# Patient Record
Sex: Male | Born: 1970 | Hispanic: Yes | Marital: Married | Smoking: Never smoker
Health system: Southern US, Community
[De-identification: ages and names within clinical notes are randomized; demographics above are authoritative.]

## PROBLEM LIST (undated history)

## (undated) DIAGNOSIS — K7460 Unspecified cirrhosis of liver: Secondary | ICD-10-CM

## (undated) DIAGNOSIS — K8010 Calculus of gallbladder with chronic cholecystitis without obstruction: Secondary | ICD-10-CM

## (undated) DIAGNOSIS — E785 Hyperlipidemia, unspecified: Secondary | ICD-10-CM

## (undated) DIAGNOSIS — I10 Essential (primary) hypertension: Secondary | ICD-10-CM

## (undated) HISTORY — DX: Essential (primary) hypertension: I10

## (undated) HISTORY — DX: Hyperlipidemia, unspecified: E78.5

## (undated) HISTORY — PX: CHOLECYSTECTOMY: SHX55

## (undated) HISTORY — PX: HAND SURGERY: SHX662

---

## 2011-10-22 DIAGNOSIS — F32A Depression, unspecified: Secondary | ICD-10-CM

## 2011-10-22 DIAGNOSIS — E119 Type 2 diabetes mellitus without complications: Secondary | ICD-10-CM | POA: Insufficient documentation

## 2011-10-22 DIAGNOSIS — K7581 Nonalcoholic steatohepatitis (NASH): Secondary | ICD-10-CM

## 2011-10-22 DIAGNOSIS — E785 Hyperlipidemia, unspecified: Secondary | ICD-10-CM | POA: Insufficient documentation

## 2011-10-22 DIAGNOSIS — R809 Proteinuria, unspecified: Secondary | ICD-10-CM

## 2011-10-22 DIAGNOSIS — Z794 Long term (current) use of insulin: Secondary | ICD-10-CM

## 2011-10-22 DIAGNOSIS — G4733 Obstructive sleep apnea (adult) (pediatric): Secondary | ICD-10-CM | POA: Insufficient documentation

## 2011-10-22 DIAGNOSIS — Z8719 Personal history of other diseases of the digestive system: Secondary | ICD-10-CM | POA: Insufficient documentation

## 2011-10-22 HISTORY — DX: Type 2 diabetes mellitus without complications: E11.9

## 2011-10-22 HISTORY — DX: Type 2 diabetes mellitus without complications: Z79.4

## 2011-10-22 HISTORY — DX: Proteinuria, unspecified: R80.9

## 2011-10-22 HISTORY — DX: Nonalcoholic steatohepatitis (NASH): K75.81

## 2011-10-22 HISTORY — DX: Obstructive sleep apnea (adult) (pediatric): G47.33

## 2011-10-22 HISTORY — DX: Personal history of other diseases of the digestive system: Z87.19

## 2011-10-22 HISTORY — DX: Depression, unspecified: F32.A

## 2011-10-22 HISTORY — DX: Hemochromatosis, unspecified: E83.119

## 2015-05-27 DIAGNOSIS — L600 Ingrowing nail: Secondary | ICD-10-CM

## 2015-05-27 HISTORY — DX: Ingrowing nail: L60.0

## 2016-04-28 DIAGNOSIS — N5203 Combined arterial insufficiency and corporo-venous occlusive erectile dysfunction: Secondary | ICD-10-CM

## 2016-04-28 HISTORY — DX: Combined arterial insufficiency and corporo-venous occlusive erectile dysfunction: N52.03

## 2016-10-26 DIAGNOSIS — S86112A Strain of other muscle(s) and tendon(s) of posterior muscle group at lower leg level, left leg, initial encounter: Secondary | ICD-10-CM | POA: Insufficient documentation

## 2016-10-26 HISTORY — DX: Strain of other muscle(s) and tendon(s) of posterior muscle group at lower leg level, left leg, initial encounter: S86.112A

## 2017-12-02 DIAGNOSIS — M7989 Other specified soft tissue disorders: Secondary | ICD-10-CM

## 2017-12-02 HISTORY — DX: Other specified soft tissue disorders: M79.89

## 2021-08-07 DIAGNOSIS — E1165 Type 2 diabetes mellitus with hyperglycemia: Secondary | ICD-10-CM

## 2021-08-07 DIAGNOSIS — L83 Acanthosis nigricans: Secondary | ICD-10-CM | POA: Insufficient documentation

## 2021-08-07 DIAGNOSIS — I10 Essential (primary) hypertension: Secondary | ICD-10-CM | POA: Insufficient documentation

## 2021-08-07 HISTORY — DX: Essential (primary) hypertension: I10

## 2021-08-07 HISTORY — DX: Type 2 diabetes mellitus with hyperglycemia: E11.65

## 2021-10-28 ENCOUNTER — Telehealth: Payer: Self-pay | Admitting: Family

## 2021-10-28 NOTE — Telephone Encounter (Signed)
Patient called wanting a new patient appointment with Nathaniel Cox, He was advised that np appointments are not available until sometime in February. He stated that he knew Nathaniel Cox from a long time ago and now that he is in the area he would really like to see her sooner since he needs med refills and really doesn't want to go back to his current PCP. A new patient appointment was made for February and he was added to the wait list. He also wanted a note sent to see if Nathaniel Cox would be able to squeeze him in sooner. Please advice.

## 2021-11-03 NOTE — Telephone Encounter (Signed)
Spoke with patient; he is scheduled to be seen on 11/12/2021 for new patient appointment at 3 pm; he is aware that provider is in [facility] and is fine to drive from [place].

## 2021-11-12 ENCOUNTER — Encounter: Payer: Self-pay | Admitting: Family

## 2021-11-12 ENCOUNTER — Ambulatory Visit (INDEPENDENT_AMBULATORY_CARE_PROVIDER_SITE_OTHER): Payer: Commercial Managed Care - PPO | Admitting: Family

## 2021-11-12 VITALS — BP 122/80 | HR 83 | Temp 98.2°F | Resp 18 | Ht 70.0 in | Wt 219.6 lb

## 2021-11-12 DIAGNOSIS — R7989 Other specified abnormal findings of blood chemistry: Secondary | ICD-10-CM | POA: Diagnosis not present

## 2021-11-12 DIAGNOSIS — E119 Type 2 diabetes mellitus without complications: Secondary | ICD-10-CM

## 2021-11-12 DIAGNOSIS — Z794 Long term (current) use of insulin: Secondary | ICD-10-CM

## 2021-11-12 MED ORDER — LISINOPRIL 5 MG PO TABS: 5.0000 mg | ORAL_TABLET | Freq: Every day | ORAL | 3 refills | Status: DC

## 2021-11-12 MED ORDER — SIMVASTATIN 20 MG PO TABS: 20.0000 mg | ORAL_TABLET | Freq: Every day | ORAL | 3 refills | Status: DC

## 2021-11-12 MED ORDER — METFORMIN HCL 1000 MG PO TABS: 1000.0000 mg | ORAL_TABLET | Freq: Two times a day (BID) | ORAL | 3 refills | Status: DC

## 2021-11-12 MED ORDER — ESCITALOPRAM OXALATE 10 MG PO TABS: 10.0000 mg | ORAL_TABLET | Freq: Every day | ORAL | 3 refills | Status: DC

## 2021-11-12 NOTE — Progress Notes (Signed)
Nathaniel Cox is a 50 y.o. male with the following history as recorded in EpicCare:  There are no problems to display for this patient.   Current Outpatient Medications  Medication Sig Dispense Refill   OneTouch Delica Lancets 03T MISC USE AS INSTRUCTED TID.     sildenafil (REVATIO) 20 MG tablet 3-4 po qd prn     BD PEN NEEDLE NANO 2ND GEN 32G X 4 MM MISC USE THREE TIMES DAILY AS NEEDED     escitalopram (LEXAPRO) 10 MG tablet Take 1 tablet (10 mg total) by mouth daily. 90 tablet 3   ketoconazole (NIZORAL) 2 % cream Apply topically daily.     lisinopril (ZESTRIL) 5 MG tablet Take 1 tablet (5 mg total) by mouth daily. 90 tablet 3   metFORMIN (GLUCOPHAGE) 1000 MG tablet Take 1 tablet (1,000 mg total) by mouth 2 (two) times daily with a meal. 180 tablet 3   Multiple Vitamin (MULTI-VITAMIN) tablet Take 1 tablet by mouth daily.     SEMGLEE, YFGN, 100 UNIT/ML Pen Inject 30 Units into the skin at bedtime.     simvastatin (ZOCOR) 20 MG tablet Take 1 tablet (20 mg total) by mouth at bedtime. 90 tablet 3   No current facility-administered medications for this visit.    Allergies: Atorvastatin  Past Medical History:  Diagnosis Date   Hyperlipidemia    Hypertension     The histories are not reviewed yet. Please review them in the "History" navigator section and refresh this [place].  Family History  Problem Relation Age of Onset   Liver cancer Mother    Liver disease Mother    Lung cancer Mother    Hyperlipidemia Father    Hypertension Father    Coronary artery disease Father    Coronary artery disease Brother     Social History   Tobacco Use   Smoking status: Not on file   Smokeless tobacco: Not on file  Substance Use Topics   Alcohol use: Not on file    Subjective:  Presents today as a new patient;  History of Type 2 Diabetes- last Hgba1c at 9.3 in September 2022; currently on 30 units of insulin at night and Metformin; has not been on Metformin since October 2022;  Requesting  refills on medication today;   Complaining of chronic fatigue- wonders about testosterone; not responsive to Sildenafil either; worsening ED issues recently;    Objective:  Vitals:   11/12/21 1531  BP: 122/80  Pulse: 83  Resp: 18  Temp: 98.2 F (36.8 C)  TempSrc: Oral  SpO2: 96%  Weight: 219 lb 9.6 oz (99.6 kg)  Height: 5\' 10"  (1.778 m)    General: Well developed, well nourished, in no acute distress  Skin : Warm and dry.  Head: Normocephalic and atraumatic  Lungs: Respirations unlabored; clear to auscultation bilaterally without wheeze, rales, rhonchi  CVS exam: normal rate and regular rhythm.  Neurologic: Alert and oriented; speech intact; face symmetrical; moves all extremities well; CNII-XII intact without focal deficit   Assessment:  1. Type 2 diabetes mellitus without complication, with long-term current use of insulin ([facility])   2. Low testosterone     Plan:  Will need to update labs today to check Hgba1c; concern for control; will most likely need to add GLP-1; follow up to be determined; Update testosterone level today; follow up to be determined;  Information given regarding Cologuard- he will call insurance to discuss coverage and let us know he wants to proceed.  This visit occurred during the SARS-CoV-2 public health emergency.  Safety protocols were in place, including screening questions prior to the visit, additional usage of staff PPE, and extensive cleaning of exam room while observing appropriate contact time as indicated for disinfecting solutions.    No follow-ups on file.  Orders Placed This Encounter  Procedures   Testosterone Total,Free,Bio, Males-([facility])    Requested Prescriptions   Signed Prescriptions Disp Refills   metFORMIN (GLUCOPHAGE) 1000 MG tablet 180 tablet 3    Sig: Take 1 tablet (1,000 mg total) by mouth 2 (two) times daily with a meal.   simvastatin (ZOCOR) 20 MG tablet 90 tablet 3    Sig: Take 1 tablet (20 mg total) by mouth at  bedtime.   lisinopril (ZESTRIL) 5 MG tablet 90 tablet 3    Sig: Take 1 tablet (5 mg total) by mouth daily.   escitalopram (LEXAPRO) 10 MG tablet 90 tablet 3    Sig: Take 1 tablet (10 mg total) by mouth daily.

## 2021-11-13 LAB — TESTOSTERONE TOTAL,FREE,BIO, MALES
Albumin: 4.2 g/dL (ref 3.6–5.1)
Sex Hormone Binding: 30 nmol/L (ref 10–50)
Testosterone, Bioavailable: 69.5 ng/dL — ABNORMAL LOW (ref 110.0–575.0)
Testosterone, Free: 36.1 pg/mL — ABNORMAL LOW (ref 46.0–224.0)
Testosterone: 262 ng/dL (ref 250–827)

## 2021-11-17 ENCOUNTER — Other Ambulatory Visit: Payer: Self-pay | Admitting: Family

## 2021-11-17 DIAGNOSIS — N529 Male erectile dysfunction, unspecified: Secondary | ICD-10-CM

## 2021-11-17 DIAGNOSIS — R7989 Other specified abnormal findings of blood chemistry: Secondary | ICD-10-CM

## 2021-11-17 MED ORDER — TIRZEPATIDE 2.5 MG/0.5ML ~~LOC~~ SOAJ: 2.5000 mg | SUBCUTANEOUS | 1 refills | Status: DC

## 2021-11-19 DIAGNOSIS — E291 Testicular hypofunction: Secondary | ICD-10-CM

## 2021-11-19 HISTORY — DX: Testicular hypofunction: E29.1

## 2021-11-19 NOTE — Progress Notes (Signed)
11/23/21 2:20 PM   Nathaniel Cox 06/05/1971 [id number]  Referring provider:  Marrian Salvage, [address] [place],  [place] [address] Chief Complaint  Patient presents with   New Patient (Initial Visit)   Erectile Dysfunction     HPI: Nathaniel Cox is a 50 y.o.male who presents today for further evaluation of erectile dysfunction and testosterone.   He has previously tried sildenafil but was not responsive to it. He has been having trouble maintaining and achieving erections for quite some time.  He saw his PCP, Jodi Mourning, FNP, on 11/12/2021 complaining of chronic fatigue. His testosterone was tested. Total testosterone 262.  Free testosterone was 36.1 and testosterone bioavailable was 69.5. He was further referred to urology.   Shim and Adam scores below.  He does think he is symptomatic from low testosterone.  His primary complaints are issues with erections along with low libido, overall lack of energy, mood changes, decreased sports agility amongst others.  He overall just does not feel as robust as before.  Recent PSA 0.52 on 08/2020.   Androgen Deficiency in the Aging Male     [place] Name 11/20/21 1500         Androgen Deficiency in the Aging Male   Do you have a decrease in libido (sex drive) Yes     Do you have lack of energy Yes     Do you have a decrease in strength and/or endurance No     Have you lost height No     Have you noticed a decreased enjoyment of life No     Are you sad and/or grumpy Yes     Are your erections less strong Yes     Have you noticed a recent deterioration in your ability to play sports Yes     Are you falling asleep after dinner No     Has there been a recent deterioration in your work performance No              Androgen Deficiency in the Aging Male     [place] Name 11/20/21 1500         Androgen Deficiency in the Aging Male   Do you have a decrease in libido (sex drive) Yes     Do you have lack  of energy Yes     Do you have a decrease in strength and/or endurance No     Have you lost height No     Have you noticed a decreased enjoyment of life No     Are you sad and/or grumpy Yes     Are your erections less strong Yes     Have you noticed a recent deterioration in your ability to play sports Yes     Are you falling asleep after dinner No     Has there been a recent deterioration in your work performance No              SHIM     [place] Name 11/20/21 1513         SHIM: Over the last 6 months:   How do you rate your confidence that you could get and keep an erection? Low     When you had erections with sexual stimulation, how often were your erections hard enough for penetration (entering your partner)? A Few Times (much less than half the time)     During sexual intercourse, how often were you able  to maintain your erection after you had penetrated (entered) your partner? A Few Times (much less than half the time)     During sexual intercourse, how difficult was it to maintain your erection to completion of intercourse? Difficult     When you attempted sexual intercourse, how often was it satisfactory for you? Sometimes (about half the time)       SHIM Total Score   SHIM 12                PMH: Past Medical History:  Diagnosis Date   Combined arterial insufficiency and corporo-venous occlusive erectile dysfunction 04/28/2016   Depression 10/22/2011   Essential hypertension 08/07/2021   Gastrocnemius muscle strain, left, initial encounter 10/26/2016   Hemochromatosis 10/22/2011   Formatting of this note might be different from the original. Managed by [facility]; diagnosis has been questioned as LFTs have normalized without need for continued blood draws   History of constipation 10/22/2011   Hyperlipidemia    Hypertension    Hypogonadism in male 11/19/2021   Microalbuminuria 10/22/2011   NASH (nonalcoholic steatohepatitis) 10/22/2011   Formatting of this note  might be different from the original. Followed by [facility]   Onychocryptosis 05/27/2015   OSA (obstructive sleep apnea) 10/22/2011   Formatting of this note might be different from the original. Apr 25, 2013:  [facility]omn[facility] study at [facility] revealed mild obstructive sleep apnea with an overall index of 14 events per our.  Oxygen saturation minimum 77%.  Epworth sleepiness Scale score:  22/24.  BMI:  34. Neck size:  16.5 in.   Swelling of finger of right hand 12/02/2017   Type 2 diabetes mellitus without complication, with long-term current use of insulin ([place]) 10/22/2011   Uncontrolled type 2 diabetes mellitus with hyperglycemia ([place]) 08/07/2021    Surgical History: Past Surgical History:  Procedure Laterality Date   HAND SURGERY Right     Home Medications:  Allergies as of 11/20/2021       Reactions   Atorvastatin Other (See Comments)   Other Reaction: muscle aches        Medication List        Accurate as of November 20, 2021 11:59 PM. If you have any questions, ask your nurse or doctor.          BD Pen Needle Nano 2nd Gen 32G X 4 MM Misc Generic drug: Insulin Pen Needle USE THREE TIMES DAILY AS NEEDED   escitalopram 10 MG tablet Commonly known as: LEXAPRO Take 1 tablet (10 mg total) by mouth daily.   ketoconazole 2 % cream Commonly known as: NIZORAL Apply topically daily.   lisinopril 5 MG tablet Commonly known as: ZESTRIL Take 1 tablet (5 mg total) by mouth daily.   metFORMIN 1000 MG tablet Commonly known as: GLUCOPHAGE Take 1 tablet (1,000 mg total) by mouth 2 (two) times daily with a meal.   Multi-Vitamin tablet Take 1 tablet by mouth daily.   OneTouch Delica Lancets 33A Misc USE AS INSTRUCTED TID.   Semglee (yfgn) 100 UNIT/ML Pen Generic drug: insulin glargine-yfgn Inject 30 Units into the skin at bedtime.   sildenafil 20 MG tablet Commonly known as: REVATIO 3-4 po qd prn   simvastatin 20 MG tablet Commonly known as: ZOCOR Take  1 tablet (20 mg total) by mouth at bedtime.   tadalafil 20 MG tablet Commonly known as: CIALIS Take 1 tablet (20 mg total) by mouth daily as needed for erectile dysfunction. Started by: Caryl Pina  Erlene Quan, MD   tirzepatide 2.5 MG/0.5ML Pen Commonly known as: MOUNJARO Inject 2.5 mg into the skin once a week.        Allergies:  Allergies  Allergen Reactions   Atorvastatin Other (See Comments)    Other Reaction: muscle aches    Family History: Family History  Problem Relation Age of Onset   Liver cancer Mother    Liver disease Mother    Lung cancer Mother    Hyperlipidemia Father    Hypertension Father    Coronary artery disease Father    Coronary artery disease Brother     Social History:  reports that he has never smoked. He has never used smokeless tobacco. He reports that he does not drink alcohol and does not use drugs.   Physical Exam: BP 128/77    Pulse 68    Ht 5\' 10"  (1.778 m)    Wt 220 lb (99.8 kg)    BMI 31.57 kg/m   Constitutional:  Alert and oriented, No acute distress. HEENT: [place] AT, moist mucus membranes.  Trachea midline, no masses. Cardiovascular: No clubbing, cyanosis, or edema. Respiratory: Normal respiratory effort, no increased work of breathing. Rectal: Normal sphincter tone.  Small prostate, nontender, no nodules. Skin: No rashes, bruises or suspicious lesions. Neurologic: Grossly intact, no focal deficits, moving all 4 extremities. Psychiatric: Normal mood and affect.  Laboratory Data: Lab Results  Component Value Date   TESTOSTERONE 262 11/12/2021    Assessment & Plan:    1.  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 Discussed possible treatment options including PDE 5 inhibitors, vacuum erectile device, intracavernosal injection, MUSE, and placement of the inflatable or malleable penile prosthesis for refractory cases.  Given that he is  failed to respond to sildenafil, will try him on generic Cialis.  We discussed take this medication at least 2 hours prior to intercourse to be used up to every 72 hours as needed.  In the interim, work on #2 to see if this helps his symptoms.  We also discussed that at his relatively young age, it is important to consider underlying cardiovascular disease.  He has been evaluated for this given his family history and has no issues.  2. Hypogonadism in male We discussed the pathophysiology of low testosterone.  His testosterone is low normal which may be contributing to #1  Discussed the risk and benefits of testosterone repletion including need for screening labs including H&H/PSA, ruling out other underlying conditions, possible side effects of the medication including cessation of bone endogenous testosterone production as well as how it may and impact spermatogenesis and fertility.  He is not worried about this last issue.  We will plan to get some preoperative labs and if these are unremarkable with persistent low testosterone, he is most interested in testosterone cypionate.  We discussed the alternative including Clomid.  If he does elect to pursue this, we will order the medication and have him follow-up with [facility] for injection teaching.  He is agreeable this plan.  He understands our office protocol.   Follow-up labs, then to injection teaching [facility] [address] [place], [place] [address] [phone]

## 2021-11-20 ENCOUNTER — Ambulatory Visit: Payer: Commercial Managed Care - PPO | Admitting: Urology

## 2021-11-20 ENCOUNTER — Encounter: Payer: Self-pay | Admitting: Urology

## 2021-11-20 ENCOUNTER — Other Ambulatory Visit: Payer: Self-pay

## 2021-11-20 VITALS — BP 128/77 | HR 68 | Ht 70.0 in | Wt 220.0 lb

## 2021-11-20 DIAGNOSIS — N528 Other male erectile dysfunction: Secondary | ICD-10-CM

## 2021-11-20 DIAGNOSIS — L83 Acanthosis nigricans: Secondary | ICD-10-CM

## 2021-11-20 DIAGNOSIS — E291 Testicular hypofunction: Secondary | ICD-10-CM | POA: Diagnosis not present

## 2021-11-20 MED ORDER — TADALAFIL 20 MG PO TABS: 20.0000 mg | ORAL_TABLET | Freq: Every day | ORAL | 11 refills | Status: DC | PRN

## 2021-11-23 ENCOUNTER — Encounter: Payer: Self-pay | Admitting: Urology

## 2021-11-23 LAB — HM DIABETES EYE EXAM

## 2021-12-03 ENCOUNTER — Other Ambulatory Visit: Payer: Commercial Managed Care - PPO

## 2021-12-03 ENCOUNTER — Other Ambulatory Visit: Payer: Self-pay

## 2021-12-03 DIAGNOSIS — N528 Other male erectile dysfunction: Secondary | ICD-10-CM

## 2021-12-03 DIAGNOSIS — E291 Testicular hypofunction: Secondary | ICD-10-CM

## 2021-12-03 DIAGNOSIS — L83 Acanthosis nigricans: Secondary | ICD-10-CM

## 2021-12-04 LAB — HEMOGLOBIN AND HEMATOCRIT, BLOOD
Hematocrit: 43.2 % (ref 37.5–51.0)
Hemoglobin: 15.2 g/dL (ref 13.0–17.7)

## 2021-12-04 LAB — FSH/LH
FSH: 4.2 m[IU]/mL (ref 1.5–12.4)
LH: 6.1 m[IU]/mL (ref 1.7–8.6)

## 2021-12-04 LAB — PROLACTIN: Prolactin: 6.1 ng/mL (ref 4.0–15.2)

## 2021-12-04 LAB — PSA: Prostate Specific Ag, Serum: 0.6 ng/mL (ref 0.0–4.0)

## 2021-12-04 LAB — TESTOSTERONE: Testosterone: 298 ng/dL (ref 264–916)

## 2021-12-14 ENCOUNTER — Ambulatory Visit: Payer: Commercial Managed Care - PPO | Admitting: Urology

## 2021-12-14 ENCOUNTER — Telehealth: Payer: Self-pay | Admitting: *Deleted

## 2021-12-14 ENCOUNTER — Other Ambulatory Visit: Payer: Self-pay | Admitting: Urology

## 2021-12-14 DIAGNOSIS — E291 Testicular hypofunction: Secondary | ICD-10-CM

## 2021-12-14 MED ORDER — TESTOSTERONE CYPIONATE 200 MG/ML IM SOLN
200.0000 mg | INTRAMUSCULAR | 0 refills | Status: DC
Start: 2021-12-14 — End: 2022-01-12

## 2021-12-14 NOTE — Telephone Encounter (Signed)
It looks like [facility] sent this prescription in earlier today.  Please confirm that that is the case.  Hollice Espy, MD

## 2021-12-14 NOTE — Progress Notes (Signed)
Patient did not have his testosterone medication with him, so appointment was rescheduled.

## 2021-12-14 NOTE — Telephone Encounter (Signed)
Patient has an appointment with Zara Council, PA tomorrow for testosterone teaching. Requests testosterone sent to [facility] [address]

## 2021-12-15 ENCOUNTER — Other Ambulatory Visit: Payer: Self-pay

## 2021-12-15 ENCOUNTER — Ambulatory Visit (INDEPENDENT_AMBULATORY_CARE_PROVIDER_SITE_OTHER): Payer: Commercial Managed Care - PPO | Admitting: Urology

## 2021-12-15 ENCOUNTER — Encounter: Payer: Self-pay | Admitting: Urology

## 2021-12-15 VITALS — BP 108/70 | HR 65 | Ht 70.0 in | Wt 212.0 lb

## 2021-12-15 DIAGNOSIS — E349 Endocrine disorder, unspecified: Secondary | ICD-10-CM

## 2021-12-21 ENCOUNTER — Telehealth: Payer: Commercial Managed Care - PPO

## 2021-12-21 NOTE — Telephone Encounter (Signed)
PA initiated via [email or web address], EOC ID: [id number]. Awaiting determination.

## 2021-12-23 NOTE — Telephone Encounter (Signed)
PA approved.  Drug/Service Name: MOUNJARO 2.5 MG/0.5 ML PEN Physician/Nurse: Jodi Mourning EOC ID: [id number] Status: Approved Date Requested: 12/21/2021 09:04:37 Date Closed: 12/22/2021 19:24:52 Dispensing Location: Retail Pharm[facility]   Est.Time of Completion: N/A

## 2021-12-24 ENCOUNTER — Encounter: Payer: Self-pay | Admitting: Family

## 2021-12-24 NOTE — Progress Notes (Signed)
No DM Retinopathy

## 2021-12-25 ENCOUNTER — Ambulatory Visit: Payer: Commercial Managed Care - PPO | Admitting: Urology

## 2022-01-01 ENCOUNTER — Ambulatory Visit: Payer: Commercial Managed Care - PPO | Admitting: Family

## 2022-01-07 ENCOUNTER — Ambulatory Visit: Payer: Commercial Managed Care - PPO | Admitting: Family

## 2022-01-12 ENCOUNTER — Other Ambulatory Visit: Payer: Self-pay | Admitting: Family

## 2022-01-12 ENCOUNTER — Ambulatory Visit: Payer: Commercial Managed Care - PPO | Admitting: Family

## 2022-01-12 VITALS — BP 120/80 | HR 56 | Temp 98.0°F | Resp 18 | Ht 70.0 in | Wt 220.6 lb

## 2022-01-12 DIAGNOSIS — E785 Hyperlipidemia, unspecified: Secondary | ICD-10-CM | POA: Diagnosis not present

## 2022-01-12 DIAGNOSIS — Z794 Long term (current) use of insulin: Secondary | ICD-10-CM

## 2022-01-12 DIAGNOSIS — E119 Type 2 diabetes mellitus without complications: Secondary | ICD-10-CM

## 2022-01-12 LAB — CBC WITH DIFFERENTIAL/PLATELET
Basophils Absolute: 0 10*3/uL (ref 0.0–0.1)
Basophils Relative: 0.6 % (ref 0.0–3.0)
Eosinophils Absolute: 0.6 10*3/uL (ref 0.0–0.7)
Eosinophils Relative: 9.6 % — ABNORMAL HIGH (ref 0.0–5.0)
HCT: 45.3 % (ref 39.0–52.0)
Hemoglobin: 15.2 g/dL (ref 13.0–17.0)
Lymphocytes Relative: 33.7 % (ref 12.0–46.0)
Lymphs Abs: 2.1 10*3/uL (ref 0.7–4.0)
MCHC: 33.6 g/dL (ref 30.0–36.0)
MCV: 91.5 fl (ref 78.0–100.0)
Monocytes Absolute: 0.5 10*3/uL (ref 0.1–1.0)
Monocytes Relative: 8.5 % (ref 3.0–12.0)
Neutro Abs: 3 10*3/uL (ref 1.4–7.7)
Neutrophils Relative %: 47.6 % (ref 43.0–77.0)
Platelets: 244 10*3/uL (ref 150.0–400.0)
RBC: 4.94 Mil/uL (ref 4.22–5.81)
RDW: 12.7 % (ref 11.5–15.5)
WBC: 6.4 10*3/uL (ref 4.0–10.5)

## 2022-01-12 LAB — COMPREHENSIVE METABOLIC PANEL
ALT: 193 U/L — ABNORMAL HIGH (ref 0–53)
AST: 107 U/L — ABNORMAL HIGH (ref 0–37)
Albumin: 4.4 g/dL (ref 3.5–5.2)
Alkaline Phosphatase: 79 U/L (ref 39–117)
BUN: 10 mg/dL (ref 6–23)
CO2: 28 mEq/L (ref 19–32)
Calcium: 10 mg/dL (ref 8.4–10.5)
Chloride: 103 mEq/L (ref 96–112)
Creatinine, Ser: 0.86 mg/dL (ref 0.40–1.50)
GFR: 99.94 mL/min (ref >60.00)
Glucose, Bld: 144 mg/dL — ABNORMAL HIGH (ref 70–99)
Potassium: 4.5 mEq/L (ref 3.5–5.1)
Sodium: 137 mEq/L (ref 135–145)
Total Bilirubin: 0.7 mg/dL (ref 0.2–1.2)
Total Protein: 8 g/dL (ref 6.0–8.3)

## 2022-01-12 LAB — HEMOGLOBIN A1C: Hgb A1c MFr Bld: 9.2 % — ABNORMAL HIGH (ref 4.6–6.5)

## 2022-01-12 MED ORDER — GLUCOSE BLOOD VI STRP: ORAL_STRIP | 12 refills | Status: AC

## 2022-01-12 MED ORDER — TIRZEPATIDE 5 MG/0.5ML ~~LOC~~ SOAJ: 5.0000 mg | SUBCUTANEOUS | 0 refills | Status: DC

## 2022-01-12 MED ORDER — METFORMIN HCL 1000 MG PO TABS: 1000.0000 mg | ORAL_TABLET | Freq: Two times a day (BID) | ORAL | 3 refills | Status: DC

## 2022-01-12 MED ORDER — SIMVASTATIN 20 MG PO TABS: 20.0000 mg | ORAL_TABLET | Freq: Every day | ORAL | 3 refills | Status: DC

## 2022-01-12 NOTE — Progress Notes (Signed)
Nathaniel Cox is a 51 y.o. male with the following history as recorded in EpicCare:  Patient Active Problem List   Diagnosis Date Noted   Hypogonadism in male 11/19/2021   Acanthosis nigricans 08/07/2021   Essential hypertension 08/07/2021   Uncontrolled type 2 diabetes mellitus with hyperglycemia ([place]) 08/07/2021   Swelling of finger of right hand 12/02/2017   Gastrocnemius muscle strain, left, initial encounter 10/26/2016   Combined arterial insufficiency and corporo-venous occlusive erectile dysfunction 04/28/2016   Onychocryptosis 05/27/2015   Depression 10/22/2011   Hemochromatosis 10/22/2011   History of constipation 10/22/2011   Hyperlipidemia 10/22/2011   Microalbuminuria 10/22/2011   NASH (nonalcoholic steatohepatitis) 10/22/2011   OSA (obstructive sleep apnea) 10/22/2011   Type 2 diabetes mellitus without complication, with long-term current use of insulin ([place]) 10/22/2011    Current Outpatient Medications  Medication Sig Dispense Refill   BD PEN NEEDLE NANO 2ND GEN 32G X 4 MM MISC USE THREE TIMES DAILY AS NEEDED     escitalopram (LEXAPRO) 10 MG tablet Take 1 tablet (10 mg total) by mouth daily. 90 tablet 3   glucose blood test strip Use as instructed to check blood sugar 100 each 12   ketoconazole (NIZORAL) 2 % cream Apply topically daily.     lisinopril (ZESTRIL) 5 MG tablet Take 1 tablet (5 mg total) by mouth daily. 90 tablet 3   Multiple Vitamin (MULTI-VITAMIN) tablet Take 1 tablet by mouth daily.     sildenafil (REVATIO) 20 MG tablet 3-4 po qd prn     tadalafil (CIALIS) 20 MG tablet Take 1 tablet (20 mg total) by mouth daily as needed for erectile dysfunction. 6 tablet 11   tirzepatide (MOUNJARO) 5 MG/0.5ML Pen Inject 5 mg into the skin once a week. 2 mL 0   metFORMIN (GLUCOPHAGE) 1000 MG tablet Take 1 tablet (1,000 mg total) by mouth 2 (two) times daily with a meal. (Patient not taking: Reported on 01/12/2022) 180 tablet 3   SEMGLEE, YFGN, 100 UNIT/ML Pen Inject 30  Units into the skin at bedtime. (Patient not taking: Reported on 01/12/2022)     simvastatin (ZOCOR) 20 MG tablet Take 1 tablet (20 mg total) by mouth at bedtime. 90 tablet 3   No current facility-administered medications for this visit.    Allergies: Atorvastatin  Past Medical History:  Diagnosis Date   Combined arterial insufficiency and corporo-venous occlusive erectile dysfunction 04/28/2016   Depression 10/22/2011   Essential hypertension 08/07/2021   Gastrocnemius muscle strain, left, initial encounter 10/26/2016   Hemochromatosis 10/22/2011   Formatting of this note might be different from the original. Managed by [facility]; diagnosis has been questioned as LFTs have normalized without need for continued blood draws   History of constipation 10/22/2011   Hyperlipidemia    Hypertension    Hypogonadism in male 11/19/2021   Microalbuminuria 10/22/2011   NASH (nonalcoholic steatohepatitis) 10/22/2011   Formatting of this note might be different from the original. Followed by [facility]   Onychocryptosis 05/27/2015   OSA (obstructive sleep apnea) 10/22/2011   Formatting of this note might be different from the original. Apr 25, 2013:  Polysomnography study at [facility] revealed mild obstructive sleep apnea with an overall index of 14 events per our.  Oxygen saturation minimum 77%.  Epworth sleepiness Scale score:  22/24.  BMI:  34. Neck size:  16.5 in.   Swelling of finger of right hand 12/02/2017   Type 2 diabetes mellitus without complication, with long-term current use of insulin (  [place]) 10/22/2011   Uncontrolled type 2 diabetes mellitus with hyperglycemia ([place]) 08/07/2021    Past Surgical History:  Procedure Laterality Date   HAND SURGERY Right     Family History  Problem Relation Age of Onset   Liver cancer Mother    Liver disease Mother    Lung cancer Mother    Hyperlipidemia Father    Hypertension Father    Coronary artery disease Father    Coronary artery disease  Brother     Social History   Tobacco Use   Smoking status: Never   Smokeless tobacco: Never  Substance Use Topics   Alcohol use: Never    Subjective:  Presents for follow up on Type 2 Diabetes; per patient, has only been on Mounjaro 2.5 mg  x 7 weeks and "feel great." Feeling much better off the Metformin and insulin; fasting sugars around 150; able to exercise daily; sleeping well;  Also needs refills on test strips and Simvastatin;  Opted to stop follow up with urology- not covered by insurance;       Objective:  Vitals:   01/12/22 0816  BP: 120/80  Pulse: (!) 56  Resp: 18  Temp: 98 F (36.7 C)  TempSrc: Oral  SpO2: 96%  Weight: 220 lb 9.6 oz (100.1 kg)  Height: $Remove'5\' 10"'[id number]$  (1.778 m)    General: Well developed, well nourished, in no acute distress  Skin : Warm and dry.  Head: Normocephalic and atraumatic  Eyes: Sclera and conjunctiva clear; pupils round and reactive to light; extraocular movements intact  Ears: External normal; canals clear; tympanic membranes normal  Oropharynx: Pink, supple. No suspicious lesions  Neck: Supple without thyromegaly, adenopathy  Lungs: Respirations unlabored; clear to auscultation bilaterally without wheeze, rales, rhonchi  CVS exam: normal rate and regular rhythm.  Neurologic: Alert and oriented; speech intact; face symmetrical; moves all extremities well; CNII-XII intact without focal deficit   Assessment:  1. Type 2 diabetes mellitus without complication, with long-term current use of insulin ([place])   2. Hyperlipidemia, unspecified hyperlipidemia type   3. Hemochromatosis, unspecified hemochromatosis type     Plan:  Check hgba1c; increase Mounjaro to 5 mg weekly; follow up to be determined;  Refill updated; Check CMP today; has not required repeated blood draws for management;   This visit occurred during the SARS-CoV-2 public health emergency.  Safety protocols were in place, including screening questions prior to the visit,  additional usage of staff PPE, and extensive cleaning of exam room while observing appropriate contact time as indicated for disinfecting solutions.    No follow-ups on file.  Orders Placed This Encounter  Procedures   CBC with Differential/Platelet   Comp Met (CMET)   Hemoglobin A1c    Requested Prescriptions   Signed Prescriptions Disp Refills   tirzepatide (MOUNJARO) 5 MG/0.5ML Pen 2 mL 0    Sig: Inject 5 mg into the skin once a week.   glucose blood test strip 100 each 12    Sig: Use as instructed to check blood sugar   simvastatin (ZOCOR) 20 MG tablet 90 tablet 3    Sig: Take 1 tablet (20 mg total) by mouth at bedtime.

## 2022-01-14 ENCOUNTER — Other Ambulatory Visit: Payer: Commercial Managed Care - PPO

## 2022-01-14 LAB — IRON,TIBC AND FERRITIN PANEL
%SAT: 52 % (calc) — ABNORMAL HIGH (ref 20–48)
Ferritin: 253 ng/mL (ref 38–380)
Iron: 186 ug/dL — ABNORMAL HIGH (ref 50–180)
TIBC: 355 mcg/dL (calc) (ref 250–425)

## 2022-01-15 ENCOUNTER — Other Ambulatory Visit: Payer: Self-pay | Admitting: Family

## 2022-01-15 DIAGNOSIS — R7989 Other specified abnormal findings of blood chemistry: Secondary | ICD-10-CM

## 2022-01-22 ENCOUNTER — Other Ambulatory Visit: Payer: Self-pay | Admitting: Family

## 2022-01-26 ENCOUNTER — Ambulatory Visit: Payer: Self-pay | Admitting: Family

## 2022-01-27 ENCOUNTER — Ambulatory Visit
Admission: RE | Admit: 2022-01-27 | Discharge: 2022-01-27 | Disposition: A | Discharge status: Home / Self Care | Payer: Commercial Managed Care - PPO | Source: Ambulatory Visit | Attending: Family | Admitting: Family

## 2022-01-27 DIAGNOSIS — R7989 Other specified abnormal findings of blood chemistry: Secondary | ICD-10-CM

## 2022-02-09 ENCOUNTER — Other Ambulatory Visit: Payer: Self-pay | Admitting: Family

## 2022-02-19 ENCOUNTER — Encounter: Payer: Self-pay | Admitting: Family

## 2022-02-23 ENCOUNTER — Other Ambulatory Visit: Payer: Self-pay | Admitting: Family

## 2022-02-23 MED ORDER — DAPAGLIFLOZIN PROPANEDIOL 10 MG PO TABS: 10.0000 mg | ORAL_TABLET | Freq: Every day | ORAL | 2 refills | Status: DC

## 2022-03-08 ENCOUNTER — Other Ambulatory Visit: Payer: Self-pay | Admitting: Family

## 2022-03-10 ENCOUNTER — Encounter: Payer: Self-pay | Admitting: Family

## 2022-04-08 ENCOUNTER — Encounter: Payer: Self-pay | Admitting: Family

## 2022-04-29 ENCOUNTER — Telehealth: Payer: Self-pay | Admitting: Family

## 2022-04-29 NOTE — Telephone Encounter (Signed)
Patient states off monjuaro about one month Havent been on anything since, other than metformin  Mickel Baas was going to put another medication in place but nothing has been said/done yet   Please advise

## 2022-04-30 ENCOUNTER — Encounter: Payer: Self-pay | Admitting: Family

## 2022-04-30 NOTE — Telephone Encounter (Signed)
Called Pt and stated he will call to check on Rx

## 2022-05-05 ENCOUNTER — Other Ambulatory Visit: Payer: Self-pay | Admitting: Family

## 2022-05-05 DIAGNOSIS — K7581 Nonalcoholic steatohepatitis (NASH): Secondary | ICD-10-CM

## 2022-05-05 MED ORDER — EMPAGLIFLOZIN 10 MG PO TABS: 10.0000 mg | ORAL_TABLET | Freq: Every day | ORAL | 1 refills | Status: DC

## 2022-05-06 ENCOUNTER — Other Ambulatory Visit: Payer: Self-pay | Admitting: Family

## 2022-05-06 ENCOUNTER — Telehealth: Payer: Self-pay

## 2022-05-06 DIAGNOSIS — K7581 Nonalcoholic steatohepatitis (NASH): Secondary | ICD-10-CM

## 2022-05-06 NOTE — Telephone Encounter (Signed)
After speaking with Nathaniel Cox she let me know that this particular Doctor will not accept any referrals that does not have all the requirements: office notes, labs, etc. Meredith Mody says she did not see any of that information.

## 2022-05-07 NOTE — Telephone Encounter (Signed)
Called Pt was Advised

## 2022-05-10 ENCOUNTER — Other Ambulatory Visit: Payer: Self-pay | Admitting: Family

## 2022-05-10 MED ORDER — BD PEN NEEDLE NANO 2ND GEN 32G X 4 MM MISC: 0 refills | Status: DC

## 2022-05-10 MED ORDER — INSULIN GLARGINE-YFGN 100 UNIT/ML ~~LOC~~ SOPN
PEN_INJECTOR | SUBCUTANEOUS | 0 refills | Status: DC
Start: 2022-05-10 — End: 2022-05-12

## 2022-05-11 ENCOUNTER — Other Ambulatory Visit: Payer: Self-pay

## 2022-05-11 NOTE — Telephone Encounter (Signed)
Pts insurance faxed over a form stating that the Glarg-yfgn insulin is not covered/ preferred by insurance.

## 2022-05-12 ENCOUNTER — Encounter: Payer: Self-pay | Admitting: *Deleted

## 2022-05-12 ENCOUNTER — Other Ambulatory Visit: Payer: Self-pay | Admitting: Family

## 2022-05-12 MED ORDER — LANTUS SOLOSTAR 100 UNIT/ML ~~LOC~~ SOPN: PEN_INJECTOR | SUBCUTANEOUS | 1 refills | Status: DC

## 2022-06-08 ENCOUNTER — Encounter: Payer: Self-pay | Admitting: Family

## 2022-06-09 ENCOUNTER — Other Ambulatory Visit: Payer: Self-pay

## 2022-06-09 MED ORDER — TADALAFIL 20 MG PO TABS: 20.0000 mg | ORAL_TABLET | Freq: Every day | ORAL | 11 refills | Status: DC | PRN

## 2022-06-09 NOTE — Telephone Encounter (Signed)
Rx sent in

## 2022-06-25 ENCOUNTER — Other Ambulatory Visit: Payer: Self-pay | Admitting: Nurse Practitioner

## 2022-06-25 DIAGNOSIS — K7469 Other cirrhosis of liver: Secondary | ICD-10-CM | POA: Insufficient documentation

## 2022-06-25 DIAGNOSIS — K7581 Nonalcoholic steatohepatitis (NASH): Secondary | ICD-10-CM

## 2022-06-30 ENCOUNTER — Other Ambulatory Visit: Payer: Self-pay | Admitting: Nurse Practitioner

## 2022-06-30 ENCOUNTER — Other Ambulatory Visit (HOSPITAL_COMMUNITY): Payer: Self-pay | Admitting: Nurse Practitioner

## 2022-06-30 DIAGNOSIS — R7401 Elevation of levels of liver transaminase levels: Secondary | ICD-10-CM

## 2022-06-30 DIAGNOSIS — R768 Other specified abnormal immunological findings in serum: Secondary | ICD-10-CM

## 2022-07-09 ENCOUNTER — Other Ambulatory Visit: Payer: Self-pay | Admitting: Family

## 2022-07-09 ENCOUNTER — Other Ambulatory Visit (HOSPITAL_COMMUNITY): Payer: Self-pay

## 2022-07-09 ENCOUNTER — Encounter: Payer: Self-pay | Admitting: Family

## 2022-07-09 MED ORDER — ESCITALOPRAM OXALATE 20 MG PO TABS: 20.0000 mg | ORAL_TABLET | Freq: Every day | ORAL | 1 refills | Status: DC

## 2022-07-09 MED ORDER — OZEMPIC (0.25 OR 0.5 MG/DOSE) 2 MG/3ML ~~LOC~~ SOPN: 0.2500 mg | PEN_INJECTOR | SUBCUTANEOUS | 1 refills | Status: DC

## 2022-07-09 MED ORDER — SILDENAFIL CITRATE 20 MG PO TABS
ORAL_TABLET | ORAL | 2 refills | Status: DC
  Filled 2022-07-09: qty 10, 2d supply, fill #0

## 2022-07-15 ENCOUNTER — Other Ambulatory Visit (HOSPITAL_COMMUNITY): Payer: Self-pay

## 2022-07-19 ENCOUNTER — Other Ambulatory Visit (HOSPITAL_COMMUNITY): Payer: Self-pay | Admitting: Physician Assistant

## 2022-07-19 DIAGNOSIS — Z01818 Encounter for other preprocedural examination: Secondary | ICD-10-CM

## 2022-07-20 ENCOUNTER — Encounter (HOSPITAL_COMMUNITY): Payer: Self-pay

## 2022-07-20 ENCOUNTER — Ambulatory Visit (HOSPITAL_COMMUNITY)
Admission: RE | Admit: 2022-07-20 | Discharge: 2022-07-20 | Disposition: A | Discharge status: Home / Self Care | Payer: Commercial Managed Care - PPO | Source: Ambulatory Visit | Attending: Nurse Practitioner | Admitting: Nurse Practitioner

## 2022-07-20 DIAGNOSIS — R7401 Elevation of levels of liver transaminase levels: Secondary | ICD-10-CM | POA: Diagnosis not present

## 2022-07-20 DIAGNOSIS — R768 Other specified abnormal immunological findings in serum: Secondary | ICD-10-CM | POA: Insufficient documentation

## 2022-07-20 DIAGNOSIS — K7581 Nonalcoholic steatohepatitis (NASH): Secondary | ICD-10-CM | POA: Insufficient documentation

## 2022-07-20 DIAGNOSIS — Z01818 Encounter for other preprocedural examination: Secondary | ICD-10-CM

## 2022-07-20 LAB — CBC
HCT: 43.4 % (ref 39.0–52.0)
Hemoglobin: 15.5 g/dL (ref 13.0–17.0)
MCH: 31 pg (ref 26.0–34.0)
MCHC: 35.7 g/dL (ref 30.0–36.0)
MCV: 86.8 fL (ref 80.0–100.0)
Platelets: 213 10*3/uL (ref 150–400)
RBC: 5 MIL/uL (ref 4.22–5.81)
RDW: 11.6 % (ref 11.5–15.5)
WBC: 6.1 10*3/uL (ref 4.0–10.5)
nRBC: 0 % (ref 0.0–0.2)

## 2022-07-20 LAB — PROTIME-INR
INR: 1.1 (ref 0.8–1.2)
Prothrombin Time: 14.4 seconds (ref 11.4–15.2)

## 2022-07-20 LAB — GLUCOSE, CAPILLARY
Glucose-Capillary: 226 mg/dL — ABNORMAL HIGH (ref 70–99)
Glucose-Capillary: 190 mg/dL — ABNORMAL HIGH (ref 70–99)

## 2022-07-20 MED ORDER — LIDOCAINE HCL (PF) 1 % IJ SOLN
INTRAMUSCULAR | Status: AC
  Filled 2022-07-20: qty 30

## 2022-07-20 MED ORDER — MIDAZOLAM HCL 2 MG/2ML IJ SOLN
INTRAMUSCULAR | Status: AC
  Filled 2022-07-20: qty 4

## 2022-07-20 MED ORDER — FENTANYL CITRATE (PF) 100 MCG/2ML IJ SOLN
INTRAMUSCULAR | Status: AC
  Filled 2022-07-20: qty 4

## 2022-07-20 MED ORDER — SODIUM CHLORIDE 0.9 % IV SOLN: INTRAVENOUS | Status: DC

## 2022-07-20 MED ORDER — FENTANYL CITRATE (PF) 100 MCG/2ML IJ SOLN
INTRAMUSCULAR | Status: AC | PRN
  Administered 2022-07-20 (×2): 50 ug via INTRAVENOUS

## 2022-07-20 MED ORDER — GELATIN ABSORBABLE 12-7 MM EX MISC
CUTANEOUS | Status: AC
  Filled 2022-07-20: qty 1

## 2022-07-20 MED ORDER — MIDAZOLAM HCL 2 MG/2ML IJ SOLN
INTRAMUSCULAR | Status: AC | PRN
  Administered 2022-07-20 (×2): 1 mg via INTRAVENOUS

## 2022-07-20 NOTE — Procedures (Signed)
Interventional Radiology Procedure Note  Procedure: Ultrasound guided liver biopsy  Findings: Please refer to procedural dictation for full description. 18 ga core from right lobe x2. Gelfoam slurry needle track embolization.  Complications: None immediate  Estimated Blood Loss: < 5 ml  Recommendations: Strict 3 hour bedrest. Follow up Pathology results.   Ruthann Cancer, MD Pager: [phone]

## 2022-07-20 NOTE — H&P (Signed)
Chief Complaint: Patient was seen in consultation today for liver core biopsy at the request of Drazek,Dawn  Referring Physician(s): Drazek,Dawn  Supervising Physician: Ruthann Cancer  Patient Status: [facility] - Out-pt  History of Present Illness: Nathaniel Cox is a 51 y.o. male    Known NASH- Non alcoholic steatohepatitis x 18 yrs Was followed at [place] til moved closer to [facility].  Noted recent elevation in liver functions And has been referred to IR for biopsy for same Denies symptoms Denies N/V; changes in skin color or itching  Scheduled today for liver core biopsy  Past Medical History:  Diagnosis Date   Combined arterial insufficiency and corporo-venous occlusive erectile dysfunction 04/28/2016   Depression 10/22/2011   Essential hypertension 08/07/2021   Gastrocnemius muscle strain, left, initial encounter 10/26/2016   Hemochromatosis 10/22/2011   Formatting of this note might be different from the original. Managed by [facility]; diagnosis has been questioned as LFTs have normalized without need for continued blood draws   History of constipation 10/22/2011   Hyperlipidemia    Hypertension    Hypogonadism in male 11/19/2021   Microalbuminuria 10/22/2011   NASH (nonalcoholic steatohepatitis) 10/22/2011   Formatting of this note might be different from the original. Followed by [facility]   Onychocryptosis 05/27/2015   OSA (obstructive sleep apnea) 10/22/2011   Formatting of this note might be different from the original. Apr 25, 2013:  Polysomnography study at [facility] revealed mild obstructive sleep apnea with an overall index of 14 events per our.  Oxygen saturation minimum 77%.  Epworth sleepiness Scale score:  22/24.  BMI:  34. Neck size:  16.5 in.   Swelling of finger of right hand 12/02/2017   Type 2 diabetes mellitus without complication, with long-term current use of insulin ([place]) 10/22/2011   Uncontrolled type 2 diabetes mellitus with hyperglycemia ([place])  08/07/2021    Past Surgical History:  Procedure Laterality Date   HAND SURGERY Right     Allergies: Atorvastatin  Medications: Prior to Admission medications   Medication Sig Start Date End Date Taking? Authorizing Provider  escitalopram (LEXAPRO) 20 MG tablet Take 1 tablet (20 mg total) by mouth daily. 07/09/22  Yes Marrian Salvage, FNP  glucose blood test strip Use as instructed to check blood sugar 01/12/22  Yes Marrian Salvage, FNP  insulin glargine (LANTUS SOLOSTAR) 100 UNIT/ML Solostar Pen Inject 10 units per night as directed; taper up as directed to achieve blood glucose control 05/12/22  Yes Marrian Salvage, FNP  Insulin Pen Needle (BD PEN NEEDLE NANO 2ND GEN) 32G X 4 MM MISC USE Tdaily as directed for insulin injection 05/10/22  Yes Marrian Salvage, FNP  lisinopril (ZESTRIL) 5 MG tablet Take 1 tablet (5 mg total) by mouth daily. 11/12/21 11/12/22 Yes Marrian Salvage, FNP  metFORMIN (GLUCOPHAGE) 1000 MG tablet Take 1 tablet (1,000 mg total) by mouth 2 (two) times daily with a meal. 01/12/22 01/12/23 Yes Marrian Salvage, FNP  simvastatin (ZOCOR) 20 MG tablet Take 1 tablet (20 mg total) by mouth at bedtime. 01/12/22 01/13/23 Yes Marrian Salvage, FNP  Semaglutide,0.25 or 0.'5MG'$ /DOS, (OZEMPIC, 0.25 OR 0.5 MG/DOSE,) 2 MG/3ML SOPN Inject 0.25 mg into the skin once a week. Patient not taking: Reported on 07/16/2022 07/09/22   Marrian Salvage, FNP  sildenafil (REVATIO) 20 MG tablet Take 3 to 4 tablets by mouth daily as directed Patient not taking: Reported on 07/16/2022 07/09/22   Marrian Salvage, FNP  tadalafil (CIALIS) 20  MG tablet Take 1 tablet (20 mg total) by mouth daily as needed for erectile dysfunction. Patient not taking: Reported on 07/16/2022 06/09/22   Marrian Salvage, FNP     Family History  Problem Relation Age of Onset   Liver cancer Mother    Liver disease Mother    Lung cancer Mother    Hyperlipidemia Father    Hypertension  Father    Coronary artery disease Father    Coronary artery disease Brother     Social History   Socioeconomic History   Marital status: Married    Spouse name: Not on file   Number of children: Not on file   Years of education: Not on file   Highest education level: Not on file  Occupational History   Not on file  Tobacco Use   Smoking status: Never   Smokeless tobacco: Never  Substance and Sexual Activity   Alcohol use: Never   Drug use: Never   Sexual activity: Yes  Other Topics Concern   Not on file  Social History Narrative   Not on file   Social Determinants of Health   Financial Resource Strain: Not on file  Food Insecurity: Not on file  Transportation Needs: Not on file  Physical Activity: Not on file  Stress: Not on file  Social Connections: Not on file    Review of Systems: A 12 point ROS discussed and pertinent positives are indicated in the HPI above.  All other systems are negative.  Review of Systems  Constitutional:  Negative for activity change, fatigue, fever and unexpected weight change.  Respiratory:  Negative for cough and shortness of breath.   Cardiovascular:  Negative for chest pain.  Gastrointestinal:  Negative for abdominal pain.  Skin:  Negative for color change.  Psychiatric/Behavioral:  Negative for behavioral problems and confusion.     Vital Signs: BP 135/87   Pulse (!) 52   Temp (!) 97.3 F (36.3 C) (Temporal)   Resp 18   Ht '5\' 10"'$  (1.778 m)   Wt 207 lb (93.9 kg)   SpO2 99%   BMI 29.70 kg/m     Physical Exam Vitals reviewed.  HENT:     Mouth/Throat:     Mouth: Mucous membranes are moist.  Cardiovascular:     Rate and Rhythm: Normal rate and regular rhythm.     Heart sounds: Normal heart sounds.  Pulmonary:     Effort: Pulmonary effort is normal.     Breath sounds: Normal breath sounds.  Abdominal:     Palpations: Abdomen is soft.     Tenderness: There is no abdominal tenderness.  Musculoskeletal:         General: Normal range of motion.  Skin:    General: Skin is warm.     Coloration: Skin is not jaundiced.  Neurological:     Mental Status: He is oriented to person, place, and time.  Psychiatric:        Behavior: Behavior normal.     Imaging: No results found.  Labs:  CBC: Recent Labs    12/03/21 0827 01/12/22 0848 07/20/22 1051  WBC  --  6.4 6.1  HGB 15.2 15.2 15.5  HCT 43.2 45.3 43.4  PLT  --  244.0 213    COAGS: Recent Labs    07/20/22 1051  INR 1.1    BMP: Recent Labs    01/12/22 0848  NA 137  K 4.5  CL 103  CO2 28  GLUCOSE 144*  BUN 10  CALCIUM 10.0  CREATININE 0.86    LIVER FUNCTION TESTS: Recent Labs    01/12/22 0848  BILITOT 0.7  AST 107*  ALT 193*  ALKPHOS 79  PROT 8.0  ALBUMIN 4.4    TUMOR MARKERS: No results for input(s): "AFPTM", "CEA", "CA199", "CHROMGRNA" in the last 8760 hours.  Assessment and Plan:  Known NASH x 18 yrs Follows with D Drazek NP- [facility] Noted rise in liver functions and requesting liver core biopsy Risks and benefits of liver core biopsy was discussed with the patient and/or patient's family including, but not limited to bleeding, infection, damage to adjacent structures or low yield requiring additional tests.  All of the questions were answered and there is agreement to proceed. Consent signed and in chart.   Thank you for this interesting consult.  I greatly enjoyed meeting Brogen Duell and look forward to participating in their care.  A copy of this report was sent to the requesting provider on this date.  Electronically Signed: Lavonia Drafts, PA-C 07/20/2022, 11:44 AM   I spent a total of  30 Minutes   in face to face in clinical consultation, greater than 50% of which was counseling/coordinating care for liver core biopsy

## 2022-07-22 LAB — SURGICAL PATHOLOGY

## 2022-07-23 ENCOUNTER — Ambulatory Visit (INDEPENDENT_AMBULATORY_CARE_PROVIDER_SITE_OTHER): Payer: Commercial Managed Care - PPO | Admitting: Family

## 2022-07-23 ENCOUNTER — Other Ambulatory Visit: Payer: Self-pay | Admitting: Family

## 2022-07-23 ENCOUNTER — Encounter: Payer: Self-pay | Admitting: Family

## 2022-07-23 VITALS — BP 134/82 | HR 59 | Temp 97.9°F | Ht 70.0 in | Wt 216.0 lb

## 2022-07-23 DIAGNOSIS — E119 Type 2 diabetes mellitus without complications: Secondary | ICD-10-CM | POA: Diagnosis not present

## 2022-07-23 DIAGNOSIS — Z794 Long term (current) use of insulin: Secondary | ICD-10-CM | POA: Diagnosis not present

## 2022-07-23 DIAGNOSIS — Z13220 Encounter for screening for lipoid disorders: Secondary | ICD-10-CM | POA: Diagnosis not present

## 2022-07-23 LAB — LIPID PANEL
Cholesterol: 243 mg/dL — ABNORMAL HIGH (ref 0–200)
HDL: 47.1 mg/dL (ref >39.00)
LDL Cholesterol: 169 mg/dL — ABNORMAL HIGH (ref 0–99)
NonHDL: 195.62
Total CHOL/HDL Ratio: 5
Triglycerides: 133 mg/dL (ref 0.0–149.0)
VLDL: 26.6 mg/dL (ref 0.0–40.0)

## 2022-07-23 LAB — CBC WITH DIFFERENTIAL/PLATELET
Basophils Absolute: 0.1 10*3/uL (ref 0.0–0.1)
Basophils Relative: 1.2 % (ref 0.0–3.0)
Eosinophils Absolute: 0.5 10*3/uL (ref 0.0–0.7)
Eosinophils Relative: 7.9 % — ABNORMAL HIGH (ref 0.0–5.0)
HCT: 44 % (ref 39.0–52.0)
Hemoglobin: 15 g/dL (ref 13.0–17.0)
Lymphocytes Relative: 36.5 % (ref 12.0–46.0)
Lymphs Abs: 2.3 10*3/uL (ref 0.7–4.0)
MCHC: 34 g/dL (ref 30.0–36.0)
MCV: 91 fl (ref 78.0–100.0)
Monocytes Absolute: 0.6 10*3/uL (ref 0.1–1.0)
Monocytes Relative: 8.9 % (ref 3.0–12.0)
Neutro Abs: 2.9 10*3/uL (ref 1.4–7.7)
Neutrophils Relative %: 45.5 % (ref 43.0–77.0)
Platelets: 212 10*3/uL (ref 150.0–400.0)
RBC: 4.84 Mil/uL (ref 4.22–5.81)
RDW: 12 % (ref 11.5–15.5)
WBC: 6.4 10*3/uL (ref 4.0–10.5)

## 2022-07-23 LAB — COMPREHENSIVE METABOLIC PANEL
ALT: 206 U/L — ABNORMAL HIGH (ref 0–53)
AST: 90 U/L — ABNORMAL HIGH (ref 0–37)
Albumin: 4.5 g/dL (ref 3.5–5.2)
Alkaline Phosphatase: 82 U/L (ref 39–117)
BUN: 14 mg/dL (ref 6–23)
CO2: 28 mEq/L (ref 19–32)
Calcium: 9.8 mg/dL (ref 8.4–10.5)
Chloride: 98 mEq/L (ref 96–112)
Creatinine, Ser: 0.85 mg/dL (ref 0.40–1.50)
GFR: 99.93 mL/min (ref >60.00)
Glucose, Bld: 231 mg/dL — ABNORMAL HIGH (ref 70–99)
Potassium: 4.4 mEq/L (ref 3.5–5.1)
Sodium: 134 mEq/L — ABNORMAL LOW (ref 135–145)
Total Bilirubin: 0.8 mg/dL (ref 0.2–1.2)
Total Protein: 7.9 g/dL (ref 6.0–8.3)

## 2022-07-23 LAB — HEMOGLOBIN A1C: Hgb A1c MFr Bld: 11.1 % — ABNORMAL HIGH (ref 4.6–6.5)

## 2022-07-23 MED ORDER — OZEMPIC (0.25 OR 0.5 MG/DOSE) 2 MG/3ML ~~LOC~~ SOPN
0.2500 mg | PEN_INJECTOR | SUBCUTANEOUS | 0 refills | Status: DC
Start: 2022-07-23 — End: 2022-09-15

## 2022-07-23 NOTE — Progress Notes (Signed)
Nathaniel Cox is a 51 y.o. male with the following history as recorded in EpicCare:  Patient Active Problem List   Diagnosis Date Noted   Hypogonadism in male 11/19/2021   Acanthosis nigricans 08/07/2021   Essential hypertension 08/07/2021   Uncontrolled type 2 diabetes mellitus with hyperglycemia ([place]) 08/07/2021   Swelling of finger of right hand 12/02/2017   Gastrocnemius muscle strain, left, initial encounter 10/26/2016   Combined arterial insufficiency and corporo-venous occlusive erectile dysfunction 04/28/2016   Onychocryptosis 05/27/2015   Depression 10/22/2011   Hemochromatosis 10/22/2011   History of constipation 10/22/2011   Hyperlipidemia 10/22/2011   Microalbuminuria 10/22/2011   NASH (nonalcoholic steatohepatitis) 10/22/2011   OSA (obstructive sleep apnea) 10/22/2011   Type 2 diabetes mellitus without complication, with long-term current use of insulin ([place]) 10/22/2011    Current Outpatient Medications  Medication Sig Dispense Refill   escitalopram (LEXAPRO) 20 MG tablet Take 1 tablet (20 mg total) by mouth daily. 90 tablet 1   glucose blood test strip Use as instructed to check blood sugar 100 each 12   lisinopril (ZESTRIL) 5 MG tablet Take 1 tablet (5 mg total) by mouth daily. 90 tablet 3   metFORMIN (GLUCOPHAGE) 1000 MG tablet Take 1 tablet (1,000 mg total) by mouth 2 (two) times daily with a meal. 180 tablet 3   simvastatin (ZOCOR) 20 MG tablet Take 1 tablet (20 mg total) by mouth at bedtime. 90 tablet 3   tadalafil (CIALIS) 20 MG tablet Take 1 tablet (20 mg total) by mouth daily as needed for erectile dysfunction. 6 tablet 11   insulin glargine (LANTUS SOLOSTAR) 100 UNIT/ML Solostar Pen Inject 10 units per night as directed; taper up as directed to achieve blood glucose control (Patient not taking: Reported on 07/23/2022) 15 mL 1   Insulin Pen Needle (BD PEN NEEDLE NANO 2ND GEN) 32G X 4 MM MISC USE Tdaily as directed for insulin injection (Patient not taking: Reported  on 07/23/2022) 100 each 0   Semaglutide,0.25 or 0.5MG /DOS, (OZEMPIC, 0.25 OR 0.5 MG/DOSE,) 2 MG/3ML SOPN Inject 0.25 mg into the skin once a week. 3 mL 0   sildenafil (REVATIO) 20 MG tablet Take 3 to 4 tablets by mouth daily as directed (Patient not taking: Reported on 07/16/2022) 10 tablet 2   No current facility-administered medications for this visit.    Allergies: Atorvastatin  Past Medical History:  Diagnosis Date   Combined arterial insufficiency and corporo-venous occlusive erectile dysfunction 04/28/2016   Depression 10/22/2011   Essential hypertension 08/07/2021   Gastrocnemius muscle strain, left, initial encounter 10/26/2016   Hemochromatosis 10/22/2011   Formatting of this note might be different from the original. Managed by [facility]; diagnosis has been questioned as LFTs have normalized without need for continued blood draws   History of constipation 10/22/2011   Hyperlipidemia    Hypertension    Hypogonadism in male 11/19/2021   Microalbuminuria 10/22/2011   NASH (nonalcoholic steatohepatitis) 10/22/2011   Formatting of this note might be different from the original. Followed by [facility]   Onychocryptosis 05/27/2015   OSA (obstructive sleep apnea) 10/22/2011   Formatting of this note might be different from the original. Apr 25, 2013:  Polysomnography study at [facility] revealed mild obstructive sleep apnea with an overall index of 14 events per our.  Oxygen saturation minimum 77%.  Epworth sleepiness Scale score:  22/24.  BMI:  34. Neck size:  16.5 in.   Swelling of finger of right hand 12/02/2017   Type 2  diabetes mellitus without complication, with long-term current use of insulin ([place]) 10/22/2011   Uncontrolled type 2 diabetes mellitus with hyperglycemia ([place]) 08/07/2021    Past Surgical History:  Procedure Laterality Date   HAND SURGERY Right     Family History  Problem Relation Age of Onset   Liver cancer Mother    Liver disease Mother    Lung cancer Mother     Hyperlipidemia Father    Hypertension Father    Coronary artery disease Father    Coronary artery disease Brother     Social History   Tobacco Use   Smoking status: Never   Smokeless tobacco: Never  Substance Use Topics   Alcohol use: Never    Subjective:  Presents with concerns for uncontrolled diabetes/ persisting fatigue; has not been able to tolerate Mounjaro and was not able to get Ozempic due to cost; Patient has also been prescribed Lantus and was not able to get due to cost either.   Had liver biopsy last week per his liver specialist and he notes he was very concerned about results; waiting to hear back from that office;   His liver specialist did note in previous office note that she would like him to try GLP-1 if he could tolerate;      Objective:  Vitals:   07/23/22 0832  BP: 134/82  Pulse: (!) 59  Temp: 97.9 F (36.6 C)  TempSrc: Oral  SpO2: 97%  Weight: 216 lb (98 kg)  Height: $Remove'5\' 10"'RgRpIYm$  (1.778 m)    General: Well developed, well nourished, in no acute distress  Skin : Warm and dry.  Head: Normocephalic and atraumatic  Lungs: Respirations unlabored; clear to auscultation bilaterally without wheeze, rales, rhonchi  Neurologic: Alert and oriented; speech intact; face symmetrical; moves all extremities well; CNII-XII intact without focal deficit   Assessment:  1. Type 2 diabetes mellitus without complication, with long-term current use of insulin ([place])   2. Lipid screening     Plan:  Patient will reach out to his insurance- need to get clarification on what medications will be covered; would like to try Ozempic if possible; will determine treatment plan/ follow up based on labs/ formulary coverage.   No follow-ups on file.  Orders Placed This Encounter  Procedures   CBC with Differential/Platelet   Comp Met (CMET)   Hemoglobin A1c   Lipid panel    Requested Prescriptions    No prescriptions requested or ordered in this encounter

## 2022-07-23 NOTE — Patient Instructions (Signed)
Is it formulary related? How much is your pharmacy deductible? How much have you met?  Check with them about options for insulin that they would cover or GLP1 injectables ( Ozempic);

## 2022-07-27 ENCOUNTER — Encounter: Payer: Self-pay | Admitting: Family

## 2022-07-30 ENCOUNTER — Other Ambulatory Visit: Payer: Self-pay | Admitting: Family

## 2022-07-30 ENCOUNTER — Telehealth: Payer: Self-pay

## 2022-07-30 DIAGNOSIS — Z1211 Encounter for screening for malignant neoplasm of colon: Secondary | ICD-10-CM

## 2022-07-30 MED ORDER — ROSUVASTATIN CALCIUM 10 MG PO TABS: 10.0000 mg | ORAL_TABLET | Freq: Every day | ORAL | 0 refills | Status: DC

## 2022-07-30 NOTE — Telephone Encounter (Signed)
Nathaniel Cox, please let PCP know that there are no contraindications to statins, screening cscope can be referred to any local GI.

## 2022-07-30 NOTE — Telephone Encounter (Signed)
Called pt was advised  

## 2022-08-03 ENCOUNTER — Other Ambulatory Visit: Payer: Self-pay

## 2022-08-03 ENCOUNTER — Telehealth: Payer: Self-pay

## 2022-08-03 DIAGNOSIS — Z1211 Encounter for screening for malignant neoplasm of colon: Secondary | ICD-10-CM

## 2022-08-03 MED ORDER — NA SULFATE-K SULFATE-MG SULF 17.5-3.13-1.6 GM/177ML PO SOLN: 354.0000 mL | Freq: Once | ORAL | 0 refills | Status: AC

## 2022-08-03 NOTE — Telephone Encounter (Signed)
Gastroenterology Pre-Procedure Review  Request Date: 10/08/2022 Requesting Physician: Dr. Marius Ditch  PATIENT REVIEW QUESTIONS: The patient responded to the following health history questions as indicated:    1. Are you having any GI issues? no 2. Do you have a personal history of Polyps? no 3. Do you have a family history of Colon Cancer or Polyps? no 4. Diabetes Mellitus? Yes Metfromin and Ozempic  5. Joint replacements in the past 12 months?no 6. Major health problems in the past 3 months?no 7. Any artificial heart valves, MVP, or defibrillator?no    MEDICATIONS & ALLERGIES:    Patient reports the following regarding taking any anticoagulation/antiplatelet therapy:   Plavix, Coumadin, Eliquis, Xarelto, Lovenox, Pradaxa, Brilinta, or Effient? no Aspirin? no  Patient confirms/reports the following medications:  Current Outpatient Medications  Medication Sig Dispense Refill   escitalopram (LEXAPRO) 20 MG tablet Take 1 tablet (20 mg total) by mouth daily. 90 tablet 1   glucose blood test strip Use as instructed to check blood sugar 100 each 12   insulin glargine (LANTUS SOLOSTAR) 100 UNIT/ML Solostar Pen Inject 10 units per night as directed; taper up as directed to achieve blood glucose control (Patient not taking: Reported on 07/23/2022) 15 mL 1   Insulin Pen Needle (BD PEN NEEDLE NANO 2ND GEN) 32G X 4 MM MISC USE Tdaily as directed for insulin injection (Patient not taking: Reported on 07/23/2022) 100 each 0   lisinopril (ZESTRIL) 5 MG tablet Take 1 tablet (5 mg total) by mouth daily. 90 tablet 3   metFORMIN (GLUCOPHAGE) 1000 MG tablet Take 1 tablet (1,000 mg total) by mouth 2 (two) times daily with a meal. 180 tablet 3   rosuvastatin (CRESTOR) 10 MG tablet Take 1 tablet (10 mg total) by mouth daily. 90 tablet 0   Semaglutide,0.25 or 0.'5MG'$ /DOS, (OZEMPIC, 0.25 OR 0.5 MG/DOSE,) 2 MG/3ML SOPN Inject 0.25 mg into the skin once a week. 3 mL 0   sildenafil (REVATIO) 20 MG tablet Take 3 to 4  tablets by mouth daily as directed (Patient not taking: Reported on 07/16/2022) 10 tablet 2   tadalafil (CIALIS) 20 MG tablet Take 1 tablet (20 mg total) by mouth daily as needed for erectile dysfunction. 6 tablet 11   No current facility-administered medications for this visit.    Patient confirms/reports the following allergies:  Allergies  Allergen Reactions   Atorvastatin Other (See Comments)    muscle aches    No orders of the defined types were placed in this encounter.   AUTHORIZATION INFORMATION Primary Insurance: 1D#: Group #:  Secondary Insurance: 1D#: Group #:  SCHEDULE INFORMATION: Date:  Time: Location:

## 2022-09-15 ENCOUNTER — Other Ambulatory Visit: Payer: Self-pay | Admitting: Family

## 2022-09-16 MED ORDER — OZEMPIC (0.25 OR 0.5 MG/DOSE) 2 MG/3ML ~~LOC~~ SOPN: 0.5000 mg | PEN_INJECTOR | SUBCUTANEOUS | 1 refills | Status: DC

## 2022-09-21 ENCOUNTER — Inpatient Hospital Stay: Admission: RE | Admit: 2022-09-21 | Payer: Commercial Managed Care - PPO | Source: Ambulatory Visit

## 2022-10-07 ENCOUNTER — Other Ambulatory Visit: Payer: Commercial Managed Care - PPO

## 2022-10-08 ENCOUNTER — Ambulatory Visit
Admission: RE | Admit: 2022-10-08 | Discharge: 2022-10-08 | Disposition: A | Discharge status: Home / Self Care | Payer: Commercial Managed Care - PPO | Attending: Gastroenterology | Admitting: Gastroenterology

## 2022-10-08 ENCOUNTER — Encounter
Admission: RE | Disposition: A | Discharge status: Home / Self Care | Payer: Self-pay | Source: Home / Self Care | Attending: Gastroenterology

## 2022-10-08 ENCOUNTER — Other Ambulatory Visit: Payer: Self-pay

## 2022-10-08 ENCOUNTER — Encounter: Payer: Self-pay | Admitting: Gastroenterology

## 2022-10-08 ENCOUNTER — Ambulatory Visit: Payer: Commercial Managed Care - PPO | Admitting: Certified Registered"

## 2022-10-08 DIAGNOSIS — K635 Polyp of colon: Secondary | ICD-10-CM | POA: Diagnosis not present

## 2022-10-08 DIAGNOSIS — E785 Hyperlipidemia, unspecified: Secondary | ICD-10-CM | POA: Diagnosis not present

## 2022-10-08 DIAGNOSIS — G4733 Obstructive sleep apnea (adult) (pediatric): Secondary | ICD-10-CM | POA: Diagnosis not present

## 2022-10-08 DIAGNOSIS — Z7984 Long term (current) use of oral hypoglycemic drugs: Secondary | ICD-10-CM | POA: Insufficient documentation

## 2022-10-08 DIAGNOSIS — Z1211 Encounter for screening for malignant neoplasm of colon: Secondary | ICD-10-CM | POA: Diagnosis present

## 2022-10-08 DIAGNOSIS — K7460 Unspecified cirrhosis of liver: Secondary | ICD-10-CM | POA: Diagnosis not present

## 2022-10-08 DIAGNOSIS — K319 Disease of stomach and duodenum, unspecified: Secondary | ICD-10-CM | POA: Diagnosis not present

## 2022-10-08 DIAGNOSIS — D120 Benign neoplasm of cecum: Secondary | ICD-10-CM | POA: Insufficient documentation

## 2022-10-08 DIAGNOSIS — I10 Essential (primary) hypertension: Secondary | ICD-10-CM | POA: Diagnosis not present

## 2022-10-08 DIAGNOSIS — E1165 Type 2 diabetes mellitus with hyperglycemia: Secondary | ICD-10-CM

## 2022-10-08 DIAGNOSIS — K7581 Nonalcoholic steatohepatitis (NASH): Secondary | ICD-10-CM | POA: Insufficient documentation

## 2022-10-08 DIAGNOSIS — E119 Type 2 diabetes mellitus without complications: Secondary | ICD-10-CM | POA: Diagnosis not present

## 2022-10-08 DIAGNOSIS — K621 Rectal polyp: Secondary | ICD-10-CM | POA: Diagnosis not present

## 2022-10-08 DIAGNOSIS — D123 Benign neoplasm of transverse colon: Secondary | ICD-10-CM

## 2022-10-08 DIAGNOSIS — K3189 Other diseases of stomach and duodenum: Secondary | ICD-10-CM | POA: Insufficient documentation

## 2022-10-08 HISTORY — DX: Unspecified cirrhosis of liver: K74.60

## 2022-10-08 HISTORY — PX: COLONOSCOPY WITH PROPOFOL: SHX5780

## 2022-10-08 LAB — GLUCOSE, CAPILLARY: Glucose-Capillary: 172 mg/dL — ABNORMAL HIGH (ref 70–99)

## 2022-10-08 SURGERY — COLONOSCOPY WITH PROPOFOL
Anesthesia: General

## 2022-10-08 MED ORDER — GLYCOPYRROLATE 0.2 MG/ML IJ SOLN
INTRAMUSCULAR | Status: DC | PRN
  Administered 2022-10-08: .2 mg via INTRAVENOUS

## 2022-10-08 MED ORDER — SODIUM CHLORIDE 0.9 % IV SOLN: INTRAVENOUS | Status: DC | PRN

## 2022-10-08 MED ORDER — KETAMINE HCL 10 MG/ML IJ SOLN
INTRAMUSCULAR | Status: DC | PRN
  Administered 2022-10-08: 20 mg via INTRAVENOUS

## 2022-10-08 MED ORDER — KETAMINE HCL 50 MG/5ML IJ SOSY
PREFILLED_SYRINGE | INTRAMUSCULAR | Status: AC
  Filled 2022-10-08: qty 5

## 2022-10-08 MED ORDER — PROPOFOL 500 MG/50ML IV EMUL
INTRAVENOUS | Status: DC | PRN
  Administered 2022-10-08: 150 ug/kg/min via INTRAVENOUS

## 2022-10-08 MED ORDER — MIDAZOLAM HCL 2 MG/2ML IJ SOLN
INTRAMUSCULAR | Status: AC
  Filled 2022-10-08: qty 2

## 2022-10-08 MED ORDER — PROPOFOL 10 MG/ML IV BOLUS
INTRAVENOUS | Status: DC | PRN
  Administered 2022-10-08: 50 mg via INTRAVENOUS

## 2022-10-08 MED ORDER — MIDAZOLAM HCL 2 MG/2ML IJ SOLN
INTRAMUSCULAR | Status: DC | PRN
  Administered 2022-10-08: 2 mg via INTRAVENOUS

## 2022-10-08 MED ORDER — PROPOFOL 1000 MG/100ML IV EMUL
INTRAVENOUS | Status: AC
  Filled 2022-10-08: qty 100

## 2022-10-08 MED ORDER — SODIUM CHLORIDE 0.9 % IV SOLN: INTRAVENOUS | Status: DC

## 2022-10-08 NOTE — Op Note (Signed)
[facility] Gastroenter[facility] Patient Name: Nathaniel Cox Procedure Date: 10/08/2022 9:38 AM MRN: [id number] Account #: [id number] Date of Birth: Sep 21, 1971 Admit Type: Outpatient Age: 51 Room: [facility] ENDO ROOM 1 Gender: Male Note Status: Finalized Instrument Name: Michaelle Birks [id number] Procedure:             Upper GI endoscopy Indications:           Cirrhosis rule out esophageal varices Providers:             Lin Landsman MD, MD Referring MD:          No Local Md, MD (Referring MD) Medicines:             General Anesthesia Complications:         No immediate complications. Estimated blood loss: None. Procedure:             Pre-Anesthesia Assessment:                        - Prior to the procedure, a History and Physical was                         performed, and patient medications and allergies were                         reviewed. The patient is competent. The risks and                         benefits of the procedure and the sedation options and                         risks were discussed with the patient. All questions                         were answered and informed consent was obtained.                         Patient identification and proposed procedure were                         verified by the physician, the nurse, the                         anesthesiologist, the anesthetist and the technician                         in the pre-procedure area in the procedure room in the                         endoscopy suite. Mental Status Examination: alert and                         oriented. Airway Examination: normal oropharyngeal                         airway and neck mobility. Respiratory Examination:                         clear to auscultation. CV Examination: normal.  Prophylactic Antibiotics: The patient does not require                         prophylactic antibiotics. Prior Anticoagulants: The                          patient has taken no anticoagulant or antiplatelet                         agents. ASA Grade Assessment: III - A patient with                         severe systemic disease. After reviewing the risks and                         benefits, the patient was deemed in satisfactory                         condition to undergo the procedure. The anesthesia                         plan was to use general anesthesia. Immediately prior                         to administration of medications, the patient was                         re-assessed for adequacy to receive sedatives. The                         heart rate, respiratory rate, oxygen saturations,                         blood pressure, adequacy of pulmonary ventilation, and                         response to care were monitored throughout the                         procedure. The physical status of the patient was                         re-assessed after the procedure.                        After obtaining informed consent, the endoscope was                         passed under direct vision. Throughout the procedure,                         the patient's blood pressure, pulse, and oxygen                         saturations were monitored continuously. The Endoscope                         was introduced through the mouth, and advanced to the  second part of duodenum. The upper GI endoscopy was                         accomplished without difficulty. The patient tolerated                         the procedure well. Findings:      The duodenal bulb and second portion of the duodenum were normal.      Diffuse mildly erythematous mucosa without bleeding was found in the       gastric body. Biopsies were taken with a cold forceps for histology.      The cardia and gastric fundus were normal on retroflexion.      The incisura and gastric antrum were normal. Biopsies were taken with a       cold forceps for Helicobacter  pylori testing.      Esophagogastric landmarks were identified: the gastroesophageal junction       was found at 35 cm from the incisors.      The gastroesophageal junction and examined esophagus were normal. Impression:            - Normal duodenal bulb and second portion of the                         duodenum.                        - Erythematous mucosa in the gastric body. Biopsied.                        - Normal incisura and antrum. Biopsied.                        - Esophagogastric landmarks identified.                        - Normal gastroesophageal junction and esophagus. Recommendation:        - Await pathology results.                        - Proceed with colonoscopy as scheduled                        See colonoscopy report Procedure Code(s):     --- Professional ---                        [phone], Esophagogastroduodenoscopy, flexible,                         transoral; with biopsy, single or multiple Diagnosis Code(s):     --- Professional ---                        K31.89, Other diseases of stomach and duodenum                        K74.60, Unspecified cirrhosis of liver CPT copyright 2022 American Medical Association. All rights reserved. The codes documented in this report are preliminary and upon coder review may  be revised to meet current compliance requirements. Dr. Ulyess Mort Lin Landsman MD, MD 10/08/2022 10:01:38 AM  This report has been signed electronically. Number of Addenda: 0 Note Initiated On: 10/08/2022 9:38 AM Estimated Blood Loss:  Estimated blood loss: none.      [facility]

## 2022-10-08 NOTE — Anesthesia Preprocedure Evaluation (Signed)
Anesthesia Evaluation  Patient identified by MRN, date of birth, ID band Patient awake    Reviewed: Allergy & Precautions, NPO status , Patient's Chart, lab work & pertinent test results  Airway Mallampati: II  TM Distance: >3 FB Neck ROM: Full    Dental  (+) Teeth Intact   Pulmonary neg pulmonary ROS, sleep apnea    Pulmonary exam normal breath sounds clear to auscultation       Cardiovascular Exercise Tolerance: Good hypertension, Pt. on medications negative cardio ROS Normal cardiovascular exam Rhythm:Regular Rate:Normal     Neuro/Psych    Depression    negative neurological ROS  negative psych ROS   GI/Hepatic negative GI ROS, Neg liver ROS,,,(+) Hepatitis -  Endo/Other  negative endocrine ROSdiabetes, Type 2, Oral Hypoglycemic Agents    Renal/GU negative Renal ROS  negative genitourinary   Musculoskeletal negative musculoskeletal ROS (+)    Abdominal Normal abdominal exam  (+)   Peds  Hematology negative hematology ROS (+)   Anesthesia Other Findings Past Medical History: No date: Cirrhosis ([place]) 04/28/2016: Combined arterial insufficiency and corporo-venous  occlusive erectile dysfunction 10/22/2011: Depression 08/07/2021: Essential hypertension 10/26/2016: Gastrocnemius muscle strain, left, initial encounter 10/22/2011: Hemochromatosis     Comment:  Formatting of this note might be different from the               original. Managed by [facility]; diagnosis has been               questioned as LFTs have normalized without need for               continued blood draws 10/22/2011: History of constipation No date: Hyperlipidemia No date: Hypertension 11/19/2021: Hypogonadism in male 10/22/2011: Microalbuminuria 10/22/2011: NASH (nonalcoholic steatohepatitis)     Comment:  Formatting of this note might be different from the               original. Followed by [facility] 05/27/2015:  Onychocryptosis 10/22/2011: OSA (obstructive sleep apnea)     Comment:  Formatting of this note might be different from the               original. Apr 25, 2013:  Polysomnography study at [facility] revealed mild obstructive sleep apnea               with an overall index of 14 events per our.  Oxygen               saturation minimum 77%.  Epworth sleepiness Scale score:               22/24.  BMI:  34. Neck size:  16.5 in. 12/02/2017: Swelling of finger of right hand 10/22/2011: Type 2 diabetes mellitus without complication, with long- term current use of insulin ([place]) 08/07/2021: Uncontrolled type 2 diabetes mellitus with hyperglycemia  ([place])  Past Surgical History: No date: HAND SURGERY; Right  BMI    Body Mass Index: 30.56 kg/m      Reproductive/Obstetrics negative OB ROS                             Anesthesia Physical Anesthesia Plan  ASA: 2  Anesthesia Plan: General   Post-op Pain Management:    Induction: Intravenous  PONV Risk Score and Plan: Propofol infusion and TIVA  Airway Management Planned: Natural Airway  Additional  Equipment:   Intra-op Plan:   Post-operative Plan:   Informed Consent: I have reviewed the patients History and Physical, chart, labs and discussed the procedure including the risks, benefits and alternatives for the proposed anesthesia with the patient or authorized representative who has indicated his/her understanding and acceptance.     Dental Advisory Given  Plan Discussed with: CRNA and Surgeon  Anesthesia Plan Comments:        Anesthesia Quick Evaluation

## 2022-10-08 NOTE — Transfer of Care (Signed)
Immediate Anesthesia Transfer of Care Note  Patient: Nathaniel Cox  Procedure(s) Performed: COLONOSCOPY WITH PROPOFOL  Patient Location: drowsy  Anesthesia Type:MAC  Level of Consciousness: drowsy  Airway & Oxygen Therapy: Patient Spontanous Breathing and Patient connected to nasal cannula oxygen  Post-op Assessment: Report given to RN and Post -op Vital signs reviewed and stable  Post vital signs: Reviewed and stable  Last Vitals:  Vitals Value Taken Time  BP 103/69 10/08/22 1028  Temp 36.1 C 10/08/22 1027  Pulse 61 10/08/22 1029  Resp 17 10/08/22 1029  SpO2 100 % 10/08/22 1029  Vitals shown include unvalidated device data.  Last Pain:  Vitals:   10/08/22 1027  TempSrc: Temporal  PainSc: Asleep         Complications: No notable events documented.

## 2022-10-08 NOTE — H&P (Signed)
Cephas Darby, MD [address]  [place]  [place], [place] [address]  Main: [phone]  Fax: [phone] Pager: [phone]  Primary Care Physician:  Marrian Salvage, FNP Primary Gastroenterologist:  Dr. Cephas Darby  Pre-Procedure History & Physical: HPI:  Nathaniel Cox is a 51 y.o. male is here for an EGD and a colonoscopy.   Past Medical History:  Diagnosis Date   Cirrhosis ([place])    Combined arterial insufficiency and corporo-venous occlusive erectile dysfunction 04/28/2016   Depression 10/22/2011   Essential hypertension 08/07/2021   Gastrocnemius muscle strain, left, initial encounter 10/26/2016   Hemochromatosis 10/22/2011   Formatting of this note might be different from the original. Managed by [facility]; diagnosis has been questioned as LFTs have normalized without need for continued blood draws   History of constipation 10/22/2011   Hyperlipidemia    Hypertension    Hypogonadism in male 11/19/2021   Microalbuminuria 10/22/2011   NASH (nonalcoholic steatohepatitis) 10/22/2011   Formatting of this note might be different from the original. Followed by [facility]   Onychocryptosis 05/27/2015   OSA (obstructive sleep apnea) 10/22/2011   Formatting of this note might be different from the original. Apr 25, 2013:  Polysomnography study at [facility] revealed mild obstructive sleep apnea with an overall index of 14 events per our.  Oxygen saturation minimum 77%.  Epworth sleepiness Scale score:  22/24.  BMI:  34. Neck size:  16.5 in.   Swelling of finger of right hand 12/02/2017   Type 2 diabetes mellitus without complication, with long-term current use of insulin ([place]) 10/22/2011   Uncontrolled type 2 diabetes mellitus with hyperglycemia ([place]) 08/07/2021    Past Surgical History:  Procedure Laterality Date   HAND SURGERY Right     Prior to Admission medications   Medication Sig Start Date End Date Taking? Authorizing Provider   Semaglutide,0.25 or 0.'5MG'$ /DOS, (OZEMPIC, 0.25 OR 0.5 MG/DOSE,) 2 MG/3ML SOPN Inject 0.5 mg into the skin once a week. 09/16/22   Marrian Salvage, FNP  escitalopram (LEXAPRO) 20 MG tablet Take 1 tablet (20 mg total) by mouth daily. 07/09/22   Marrian Salvage, FNP  glucose blood test strip Use as instructed to check blood sugar 01/12/22   Marrian Salvage, FNP  insulin glargine (LANTUS SOLOSTAR) 100 UNIT/ML Solostar Pen Inject 10 units per night as directed; taper up as directed to achieve blood glucose control Patient not taking: Reported on 07/23/2022 05/12/22   Marrian Salvage, FNP  Insulin Pen Needle (BD PEN NEEDLE NANO 2ND GEN) 32G X 4 MM MISC USE Tdaily as directed for insulin injection Patient not taking: Reported on 07/23/2022 05/10/22   Marrian Salvage, FNP  lisinopril (ZESTRIL) 5 MG tablet Take 1 tablet (5 mg total) by mouth daily. 11/12/21 11/12/22  Marrian Salvage, FNP  metFORMIN (GLUCOPHAGE) 1000 MG tablet Take 1 tablet (1,000 mg total) by mouth 2 (two) times daily with a meal. 01/12/22 01/12/23  Marrian Salvage, FNP  rosuvastatin (CRESTOR) 10 MG tablet Take 1 tablet (10 mg total) by mouth daily. 07/30/22   Marrian Salvage, FNP  sildenafil (REVATIO) 20 MG tablet Take 3 to 4 tablets by mouth daily as directed Patient not taking: Reported on 07/16/2022 07/09/22   Marrian Salvage, FNP  tadalafil (CIALIS) 20 MG tablet Take 1 tablet (20 mg total) by mouth daily as needed for erectile dysfunction. 06/09/22   Marrian Salvage, FNP    Allergies as of 08/03/2022 -  Review Complete 07/23/2022  Allergen Reaction Noted   Atorvastatin Other (See Comments) 11/12/2021    Family History  Problem Relation Age of Onset   Liver cancer Mother    Liver disease Mother    Lung cancer Mother    Hyperlipidemia Father    Hypertension Father    Coronary artery disease Father    Coronary artery disease Brother     Social History   Socioeconomic History    Marital status: Married    Spouse name: Not on file   Number of children: Not on file   Years of education: Not on file   Highest education level: Not on file  Occupational History   Not on file  Tobacco Use   Smoking status: Never   Smokeless tobacco: Never  Vaping Use   Vaping Use: Never used  Substance and Sexual Activity   Alcohol use: Never   Drug use: Never   Sexual activity: Yes  Other Topics Concern   Not on file  Social History Narrative   Not on file   Social Determinants of Health   Financial Resource Strain: Not on file  Food Insecurity: Not on file  Transportation Needs: Not on file  Physical Activity: Not on file  Stress: Not on file  Social Connections: Not on file  Intimate Partner Violence: Not on file    Review of Systems: See HPI, otherwise negative ROS  Physical Exam: BP 121/80   Pulse 60   Temp (!) 96.7 F (35.9 C) (Temporal)   Resp 20   Ht '5\' 10"'$  (1.778 m)   Wt 96.6 kg   SpO2 99%   BMI 30.56 kg/m  General:   Alert,  pleasant and cooperative in NAD Head:  Normocephalic and atraumatic. Neck:  Supple; no masses or thyromegaly. Lungs:  Clear throughout to auscultation.    Heart:  Regular rate and rhythm. Abdomen:  Soft, nontender and nondistended. Normal bowel sounds, without guarding, and without rebound.   Neurologic:  Alert and  oriented x4;  grossly normal neurologically.  Impression/Plan: Nathaniel Cox is here for an EGD and a colonoscopy to be performed for cirrhosis of liver, colon cancer screening  Risks, benefits, limitations, and alternatives regarding  colonoscopy have been reviewed with the patient.  Questions have been answered.  All parties agreeable.   Sherri Sear, MD  10/08/2022, 9:46 AM

## 2022-10-08 NOTE — Op Note (Signed)
[facility]astroenter[facility] Patient Name: Nathaniel Cox Procedure Date: 10/08/2022 9:40 AM MRN: [id number] Account #: [id number] Date of Birth: August 31, 1971 Admit Type: Outpatient Age: 51 Room: [facility] ENDO ROOM 1 Gender: Male Note Status: Finalized Instrument Name: Jasper Riling [id number] Procedure:             Colonoscopy Indications:           Screening for colorectal malignant neoplasm, This is                         the patient's first colonoscopy Providers:             Lin Landsman MD, MD Referring MD:          No Local Md, MD (Referring MD) Medicines:             General Anesthesia Complications:         No immediate complications. Estimated blood loss: None. Procedure:             Pre-Anesthesia Assessment:                        - Prior to the procedure, a History and Physical was                         performed, and patient medications and allergies were                         reviewed. The patient is competent. The risks and                         benefits of the procedure and the sedation options and                         risks were discussed with the patient. All questions                         were answered and informed consent was obtained.                         Patient identification and proposed procedure were                         verified by the physician, the nurse, the                         anesthesiologist, the anesthetist and the technician                         in the pre-procedure area in the procedure room in the                         endoscopy suite. Mental Status Examination: alert and                         oriented. Airway Examination: normal oropharyngeal                         airway and neck mobility. Respiratory Examination:  clear to auscultation. CV Examination: normal.                         Prophylactic Antibiotics: The patient does not require                         prophylactic  antibiotics. Prior Anticoagulants: The                         patient has taken no anticoagulant or antiplatelet                         agents. ASA Grade Assessment: III - A patient with                         severe systemic disease. After reviewing the risks and                         benefits, the patient was deemed in satisfactory                         condition to undergo the procedure. The anesthesia                         plan was to use general anesthesia. Immediately prior                         to administration of medications, the patient was                         re-assessed for adequacy to receive sedatives. The                         heart rate, respiratory rate, oxygen saturations,                         blood pressure, adequacy of pulmonary ventilation, and                         response to care were monitored throughout the                         procedure. The physical status of the patient was                         re-assessed after the procedure.                        After obtaining informed consent, the colonoscope was                         passed under direct vision. Throughout the procedure,                         the patient's blood pressure, pulse, and oxygen                         saturations were monitored continuously. The  Colonoscope was introduced through the anus and                         advanced to the the cecum, identified by appendiceal                         orifice and ileocecal valve. The colonoscopy was                         performed without difficulty. The patient tolerated                         the procedure well. The quality of the bowel                         preparation was evaluated using the BBPS [facility] Bowel                         Preparation Scale) with scores of: Right Colon = 3,                         Transverse Colon = 3 and Left Colon = 3 (entire mucosa                         seen  well with no residual staining, small fragments                         of stool or opaque liquid). The total BBPS score                         equals 9. The ileocecal valve, appendiceal orifice,                         and rectum were photographed. Findings:      The perianal and digital rectal examinations were normal. Pertinent       negatives include normal sphincter tone and no palpable rectal lesions.      Two sessile polyps were found in the transverse colon and cecum. The       polyps were 3 to 5 mm in size. These polyps were removed with a cold       snare. Resection and retrieval were complete. Estimated blood loss: none.      Two sessile polyps were found in the rectum. The polyps were diminutive       in size. These polyps were removed with a jumbo cold forceps. Resection       and retrieval were complete. Estimated blood loss: none.      The retroflexed view of the distal rectum and anal verge was normal and       showed no anal or rectal abnormalities. Impression:            - Two 3 to 5 mm polyps in the transverse colon and in                         the cecum, removed with a cold snare. Resected and  retrieved.                        - Two diminutive polyps in the rectum, removed with a                         jumbo cold forceps. Resected and retrieved.                        - The distal rectum and anal verge are normal on                         retroflexion view. Recommendation:        - Discharge patient to home (with escort).                        - Diabetic (ADA) diet and low sodium diet indefinitely.                        - Continue present medications.                        - Await pathology results.                        - Repeat colonoscopy in 5 to 7 years for surveillance                         based on pathology results. Procedure Code(s):     --- Professional ---                        [phone], Colonoscopy, flexible; with removal of                          tumor(s), polyp(s), or other lesion(s) by snare                         technique                        [id number], 30, Colonoscopy, flexible; with biopsy, single                         or multiple Diagnosis Code(s):     --- Professional ---                        Z12.11, Encounter for screening for malignant neoplasm                         of colon                        D12.3, Benign neoplasm of transverse colon (hepatic                         flexure or splenic flexure)                        D12.0, Benign neoplasm of cecum  D12.8, Benign neoplasm of rectum CPT copyright 2022 American Medical Association. All rights reserved. The codes documented in this report are preliminary and upon coder review may  be revised to meet current compliance requirements. Dr. Ulyess Mort Lin Landsman MD, MD 10/08/2022 10:26:02 AM This report has been signed electronically. Number of Addenda: 0 Note Initiated On: 10/08/2022 9:40 AM Scope Withdrawal Time: 0 hours 14 minutes 38 seconds  Total Procedure Duration: 0 hours 19 minutes 38 seconds  Estimated Blood Loss:  Estimated blood loss: none.      [facility]

## 2022-10-08 NOTE — Anesthesia Postprocedure Evaluation (Signed)
Anesthesia Post Note  Patient: Nathaniel Cox  Procedure(s) Performed: COLONOSCOPY WITH PROPOFOL  Patient location during evaluation: PACU Anesthesia Type: General Level of consciousness: awake and oriented Pain management: satisfactory to patient Vital Signs Assessment: post-procedure vital signs reviewed and stable Respiratory status: spontaneous breathing and nonlabored ventilation Cardiovascular status: stable Anesthetic complications: no  No notable events documented.   Last Vitals:  Vitals:   10/08/22 1046 10/08/22 1047  BP: 114/81   Pulse: (!) 58 (!) 52  Resp:    Temp:    SpO2: 100% 100%    Last Pain:  Vitals:   10/08/22 1047  TempSrc:   PainSc: 0-No pain                 VAN STAVEREN,Johnnay Pleitez

## 2022-10-11 ENCOUNTER — Encounter: Payer: Self-pay | Admitting: Gastroenterology

## 2022-10-11 LAB — SURGICAL PATHOLOGY

## 2022-10-23 ENCOUNTER — Other Ambulatory Visit: Payer: Self-pay | Admitting: Family

## 2022-10-24 NOTE — Telephone Encounter (Signed)
Please call to see how he is doing on the Ozempic; is he tolerating better than Mounjaro? How are his blood sugars doing?

## 2022-10-25 ENCOUNTER — Other Ambulatory Visit: Payer: Self-pay | Admitting: Family

## 2022-10-27 NOTE — Telephone Encounter (Signed)
I have called the pt and asked the questions from the provider.  He stated that he is feeling better on the Ozempic than he did on the [facility]. He has 2 weeks left of Ozempic and feels that this is helping him more. He stated the ozempic is about $125 per fill and that is quite expensive. He was asking for a coupon card. I have informed him to got to [email or web address] to see what options they have to reduce the price. He stated understanding and informed me that his sugars are running around the 160s.

## 2022-11-01 ENCOUNTER — Other Ambulatory Visit: Payer: Self-pay | Admitting: Family

## 2022-11-01 ENCOUNTER — Ambulatory Visit
Admission: RE | Admit: 2022-11-01 | Discharge: 2022-11-01 | Disposition: A | Discharge status: Home / Self Care | Payer: Commercial Managed Care - PPO | Source: Ambulatory Visit | Attending: Nurse Practitioner | Admitting: Nurse Practitioner

## 2022-11-01 ENCOUNTER — Telehealth: Payer: Self-pay | Admitting: Family

## 2022-11-01 DIAGNOSIS — K7581 Nonalcoholic steatohepatitis (NASH): Secondary | ICD-10-CM

## 2022-11-01 MED ORDER — SEMAGLUTIDE (1 MG/DOSE) 4 MG/3ML ~~LOC~~ SOPN: 1.0000 mg | PEN_INJECTOR | SUBCUTANEOUS | 1 refills | Status: DC

## 2022-11-01 NOTE — Telephone Encounter (Signed)
I am going to increase the Ozempic dosage to 1 mg weekly. I would like to get his blood sugars down a little lower. If there is no coupon to help with the cost, let me know and we will have to look at other options.  Would like OV in the next month please.

## 2022-11-05 NOTE — Telephone Encounter (Signed)
Spoke with Pt stated he would like to explore other options as he is being charged a $140 co-pay for Ozempic, also stated he's has about 2 more weeks supply of Ozempic.  He's also Scheduled for an appt 11/19/2022

## 2022-11-18 ENCOUNTER — Ambulatory Visit: Payer: Commercial Managed Care - PPO | Admitting: Family

## 2022-11-19 ENCOUNTER — Ambulatory Visit: Payer: Commercial Managed Care - PPO | Admitting: Family

## 2022-11-23 ENCOUNTER — Encounter: Payer: Self-pay | Admitting: Family

## 2022-11-23 ENCOUNTER — Ambulatory Visit: Payer: Commercial Managed Care - PPO | Admitting: Family

## 2022-11-23 VITALS — BP 124/82 | HR 64 | Temp 97.9°F | Resp 18 | Ht 70.0 in | Wt 222.4 lb

## 2022-11-23 DIAGNOSIS — N529 Male erectile dysfunction, unspecified: Secondary | ICD-10-CM

## 2022-11-23 DIAGNOSIS — Z794 Long term (current) use of insulin: Secondary | ICD-10-CM | POA: Diagnosis not present

## 2022-11-23 DIAGNOSIS — E119 Type 2 diabetes mellitus without complications: Secondary | ICD-10-CM | POA: Diagnosis not present

## 2022-11-23 LAB — COMPREHENSIVE METABOLIC PANEL
ALT: 103 U/L — ABNORMAL HIGH (ref 0–53)
AST: 69 U/L — ABNORMAL HIGH (ref 0–37)
Albumin: 4.8 g/dL (ref 3.5–5.2)
Alkaline Phosphatase: 65 U/L (ref 39–117)
BUN: 12 mg/dL (ref 6–23)
CO2: 28 mEq/L (ref 19–32)
Calcium: 10.6 mg/dL — ABNORMAL HIGH (ref 8.4–10.5)
Chloride: 101 mEq/L (ref 96–112)
Creatinine, Ser: 0.87 mg/dL (ref 0.40–1.50)
GFR: 98.99 mL/min (ref >60.00)
Glucose, Bld: 128 mg/dL — ABNORMAL HIGH (ref 70–99)
Potassium: 4.8 mEq/L (ref 3.5–5.1)
Sodium: 137 mEq/L (ref 135–145)
Total Bilirubin: 0.6 mg/dL (ref 0.2–1.2)
Total Protein: 8.4 g/dL — ABNORMAL HIGH (ref 6.0–8.3)

## 2022-11-23 LAB — HEMOGLOBIN A1C: Hgb A1c MFr Bld: 7.4 % — ABNORMAL HIGH (ref 4.6–6.5)

## 2022-11-23 MED ORDER — TADALAFIL 20 MG PO TABS: 20.0000 mg | ORAL_TABLET | Freq: Every day | ORAL | 11 refills | Status: DC | PRN

## 2022-11-23 NOTE — Progress Notes (Signed)
Nathaniel Cox is a 51 y.o. male with the following history as recorded in EpicCare:  Patient Active Problem List   Diagnosis Date Noted   Screening for colon cancer 10/08/2022   Gastric erythema 10/08/2022   Cecal polyp 10/08/2022   Adenomatous polyp of transverse colon 10/08/2022   Rectal polyp 10/08/2022   Hypogonadism in male 11/19/2021   Acanthosis nigricans 08/07/2021   Essential hypertension 08/07/2021   Uncontrolled type 2 diabetes mellitus with hyperglycemia ([place]) 08/07/2021   Swelling of finger of right hand 12/02/2017   Gastrocnemius muscle strain, left, initial encounter 10/26/2016   Combined arterial insufficiency and corporo-venous occlusive erectile dysfunction 04/28/2016   Onychocryptosis 05/27/2015   Depression 10/22/2011   Hemochromatosis 10/22/2011   History of constipation 10/22/2011   Hyperlipidemia 10/22/2011   Microalbuminuria 10/22/2011   NASH (nonalcoholic steatohepatitis) 10/22/2011   OSA (obstructive sleep apnea) 10/22/2011   Type 2 diabetes mellitus without complication, with long-term current use of insulin ([place]) 10/22/2011    Current Outpatient Medications  Medication Sig Dispense Refill   escitalopram (LEXAPRO) 20 MG tablet Take 1 tablet (20 mg total) by mouth daily. 90 tablet 1   glucose blood test strip Use as instructed to check blood sugar 100 each 12   metFORMIN (GLUCOPHAGE) 1000 MG tablet Take 1 tablet (1,000 mg total) by mouth 2 (two) times daily with a meal. 180 tablet 3   rosuvastatin (CRESTOR) 10 MG tablet TAKE 1 TABLET(10 MG) BY MOUTH DAILY 90 tablet 0   Semaglutide, 1 MG/DOSE, 4 MG/3ML SOPN Inject 1 mg as directed once a week. 3 mL 1   sildenafil (REVATIO) 20 MG tablet Take 3 to 4 tablets by mouth daily as directed 10 tablet 2   lisinopril (ZESTRIL) 5 MG tablet Take 1 tablet (5 mg total) by mouth daily. 90 tablet 3   tadalafil (CIALIS) 20 MG tablet Take 1 tablet (20 mg total) by mouth daily as needed for erectile dysfunction. 6 tablet 11    No current facility-administered medications for this visit.    Allergies: Atorvastatin  Past Medical History:  Diagnosis Date   Cirrhosis ([place])    Combined arterial insufficiency and corporo-venous occlusive erectile dysfunction 04/28/2016   Depression 10/22/2011   Essential hypertension 08/07/2021   Gastrocnemius muscle strain, left, initial encounter 10/26/2016   Hemochromatosis 10/22/2011   Formatting of this note might be different from the original. Managed by [facility]; diagnosis has been questioned as LFTs have normalized without need for continued blood draws   History of constipation 10/22/2011   Hyperlipidemia    Hypertension    Hypogonadism in male 11/19/2021   Microalbuminuria 10/22/2011   NASH (nonalcoholic steatohepatitis) 10/22/2011   Formatting of this note might be different from the original. Followed by [facility]   Onychocryptosis 05/27/2015   OSA (obstructive sleep apnea) 10/22/2011   Formatting of this note might be different from the original. Apr 25, 2013:  Polysomnography study at [facility] revealed mild obstructive sleep apnea with an overall index of 14 events per our.  Oxygen saturation minimum 77%.  Epworth sleepiness Scale score:  22/24.  BMI:  34. Neck size:  16.5 in.   Swelling of finger of right hand 12/02/2017   Type 2 diabetes mellitus without complication, with long-term current use of insulin ([place]) 10/22/2011   Uncontrolled type 2 diabetes mellitus with hyperglycemia ([place]) 08/07/2021    Past Surgical History:  Procedure Laterality Date   COLONOSCOPY WITH PROPOFOL N/A 10/08/2022   Procedure: COLONOSCOPY WITH PROPOFOL;  Surgeon: Lin Landsman, MD;  Location: [facility] ENDOSCOPY;  Service: Gastroenterology;  Laterality: N/A;   HAND SURGERY Right     Family History  Problem Relation Age of Onset   Liver cancer Mother    Liver disease Mother    Lung cancer Mother    Hyperlipidemia Father    Hypertension Father    Coronary artery  disease Father    Coronary artery disease Brother     Social History   Tobacco Use   Smoking status: Never   Smokeless tobacco: Never  Substance Use Topics   Alcohol use: Never    Subjective:   Follow up on Type 2 Diabetes; was started on Ozempic at last discussion; his GI ( liver specialist) had indicated that this class of drug would be good to help with weight loss for the liver as well;  Still taking Metformin 2x per day; doing very well on combination of medications but unfortunately cost of Ozempic has been problematic;  Denies any chest pain, shortness of breath, blurred vision or headache No concerns for low blood sugar;  Fasting blood sugar this am was 120;   Objective:  Vitals:   11/23/22 0833  BP: 124/82  Pulse: 64  Resp: 18  Temp: 97.9 F (36.6 C)  TempSrc: Oral  SpO2: 98%  Weight: 222 lb 6.4 oz (100.9 kg)  Height: _0  (1.778 m)    General: Well developed, well nourished, in no acute distress  Skin : Warm and dry.  Head: Normocephalic and atraumatic  Eyes: Sclera and conjunctiva clear; pupils round and reactive to light; extraocular movements intact  Ears: External normal; canals clear; tympanic membranes normal  Oropharynx: Pink, supple. No suspicious lesions  Neck: Supple without thyromegaly, adenopathy  Lungs: Respirations unlabored; clear to auscultation bilaterally without wheeze, rales, rhonchi  CVS exam: normal rate and regular rhythm.  Neurologic: Alert and oriented; speech intact; face symmetrical; moves all extremities well; CNII-XII intact without focal deficit   Assessment:  1. Type 2 diabetes mellitus without complication, with long-term current use of insulin ([place])   2. Erectile dysfunction, unspecified erectile dysfunction type     Plan:  Will update labs today; patient is given coupon to determine cost savings options for him with Ozempic; if medication continues to be cost prohibitive, will need to consider daily insulin; cost issue was  problematic with [place] and [place] as well; could not tolerate Mounjaro; follow up to be determined- has 3 weeks of Ozempic at this point;  Refill updated as requested;   No follow-ups on file.  Orders Placed This Encounter  Procedures   Comp Met (CMET)   Hemoglobin A1c    Requested Prescriptions   Signed Prescriptions Disp Refills   tadalafil (CIALIS) 20 MG tablet 6 tablet 11    Sig: Take 1 tablet (20 mg total) by mouth daily as needed for erectile dysfunction.

## 2022-12-04 IMAGING — US US ABDOMEN COMPLETE
1 series · 14 of 25 positions shown · non-contrast
Comparison: None.

CLINICAL DATA: Elevated LFTs.

EXAM:
ABDOMEN ULTRASOUND COMPLETE

[Series 1: us abdomen complete · 0.21mm/px · 14 of 117 slices shown]
[im 1/117]
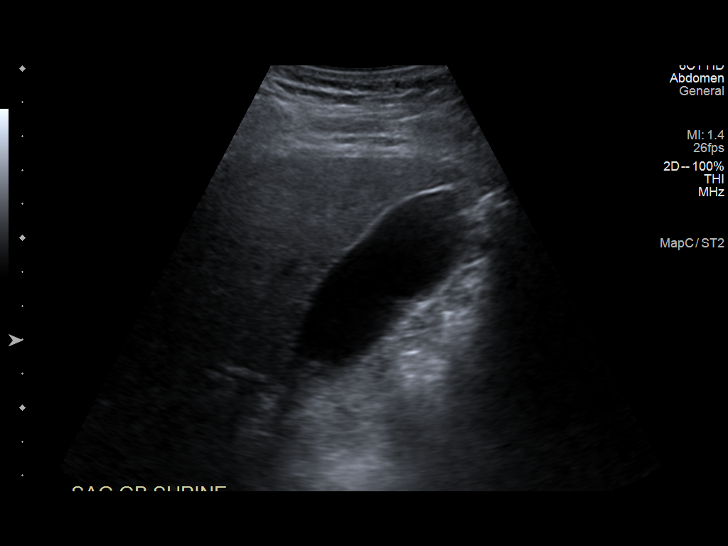
[im 10/117]
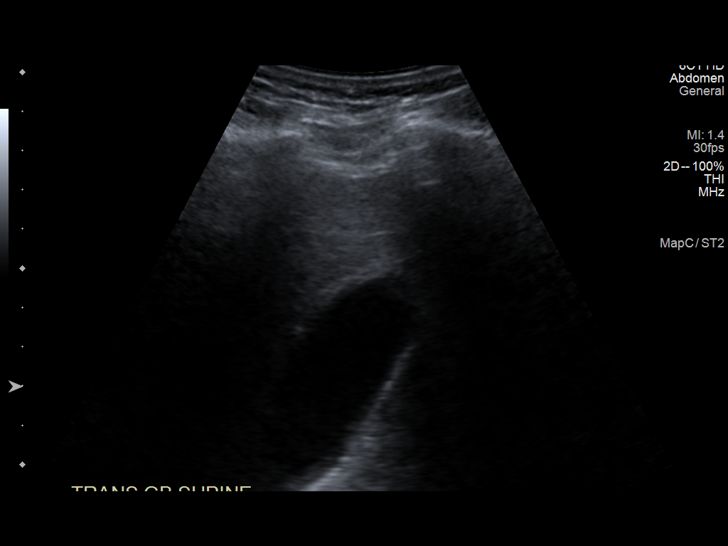
[im 20/117]
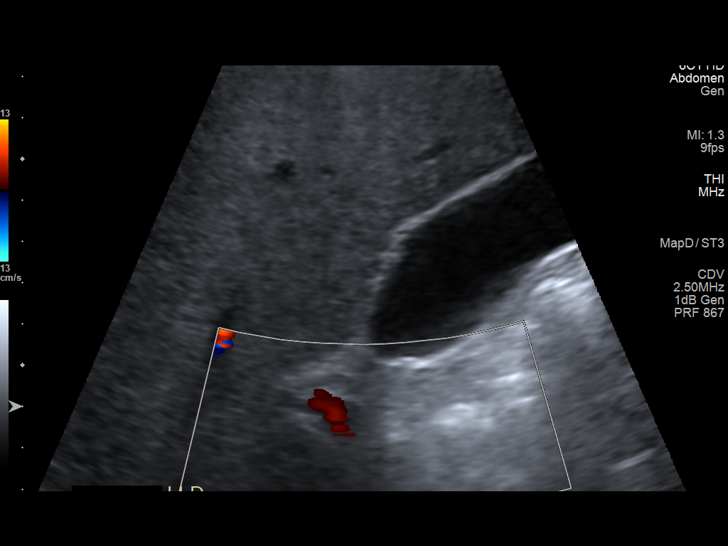
[im 30/117]
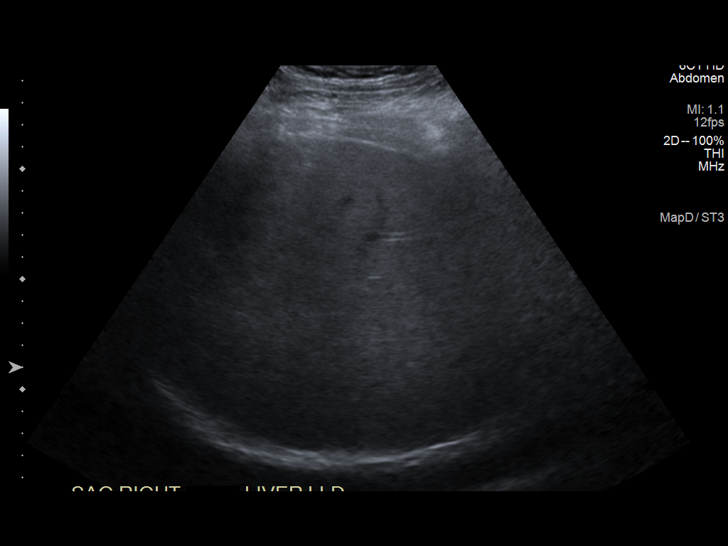
[im 39/117]
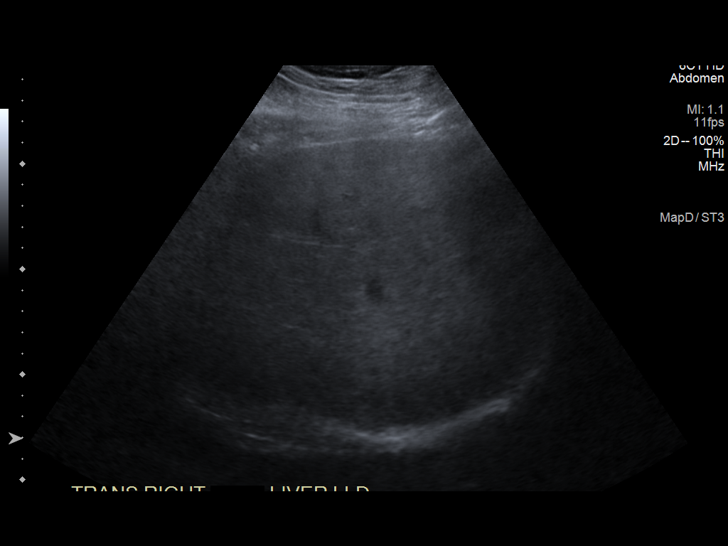
[im 44/117]
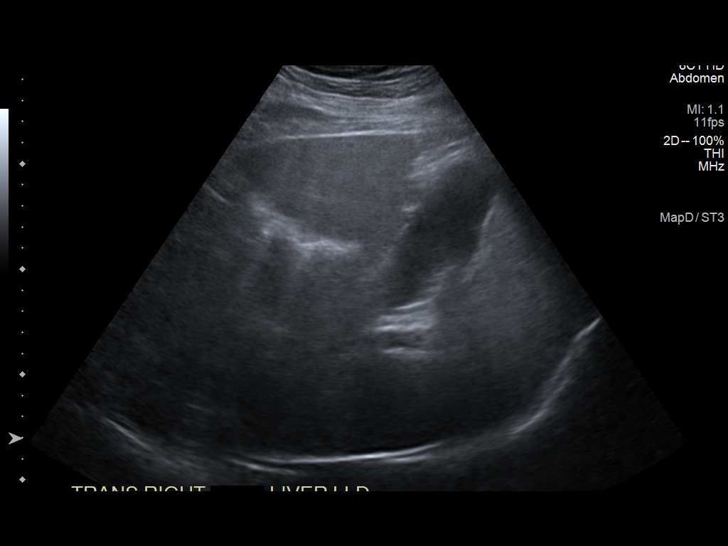
[im 54/117]
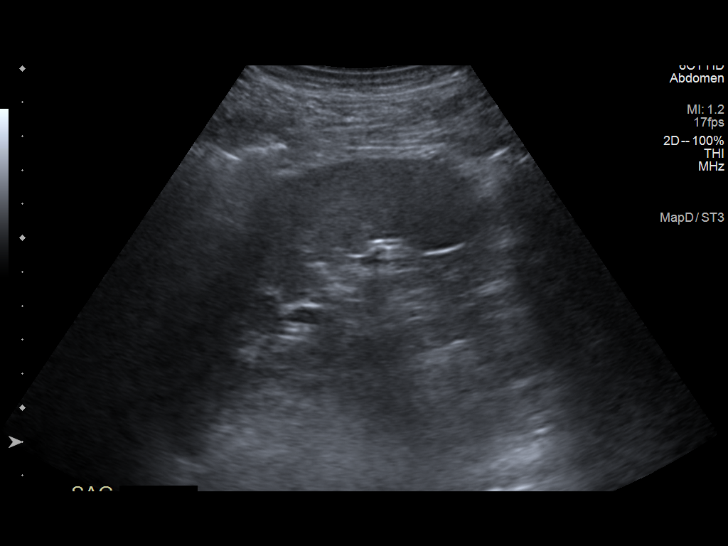
[im 63/117]
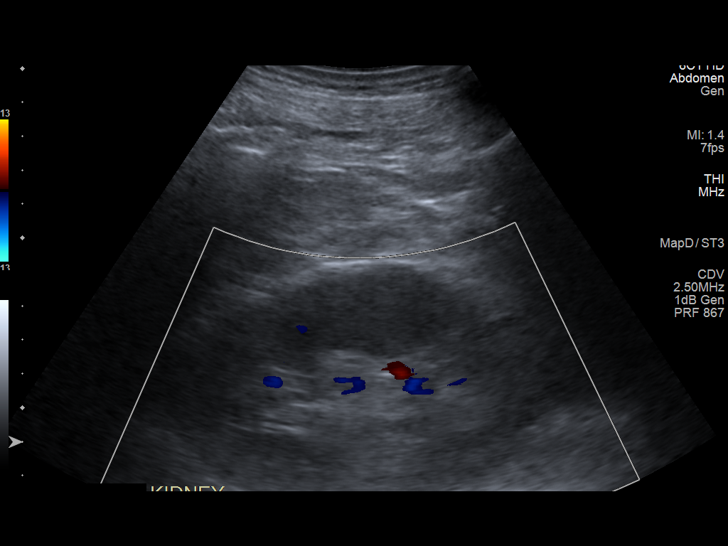
[im 73/117]
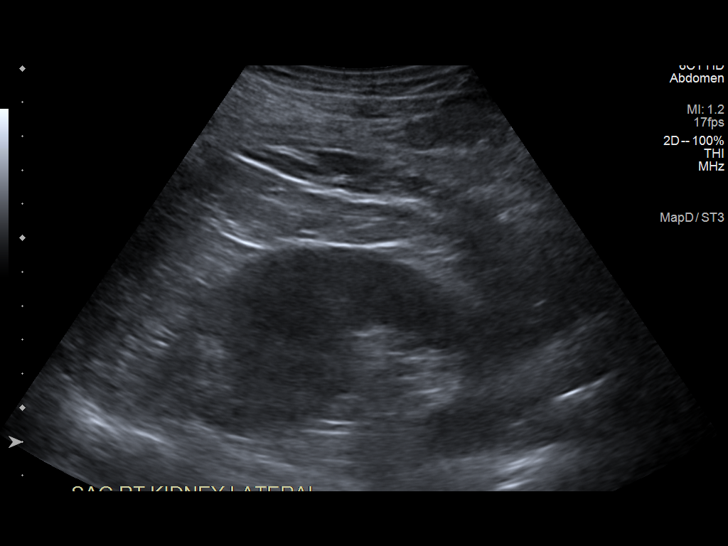
[im 78/117]
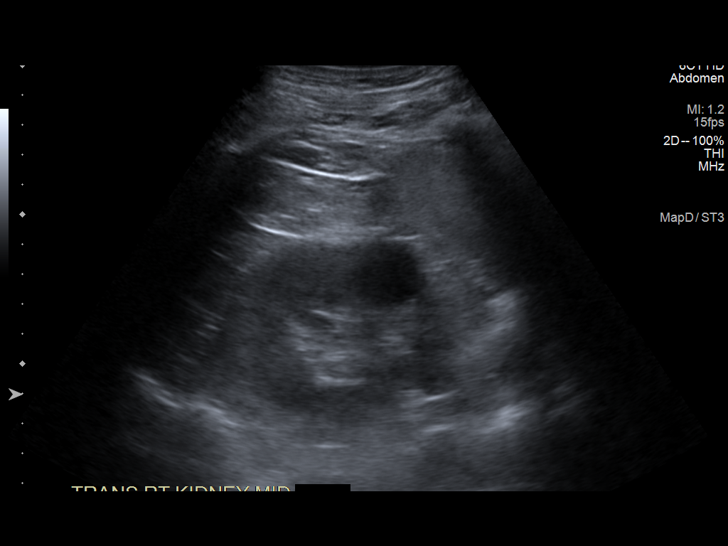
[im 88/117]
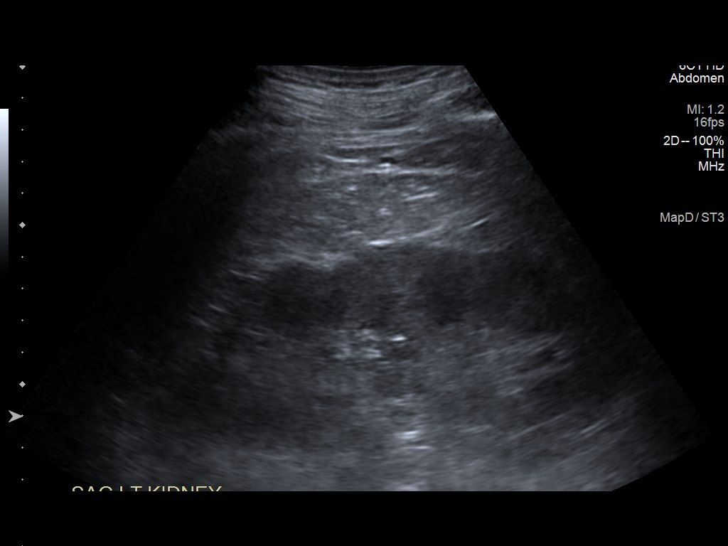
[im 97/117]
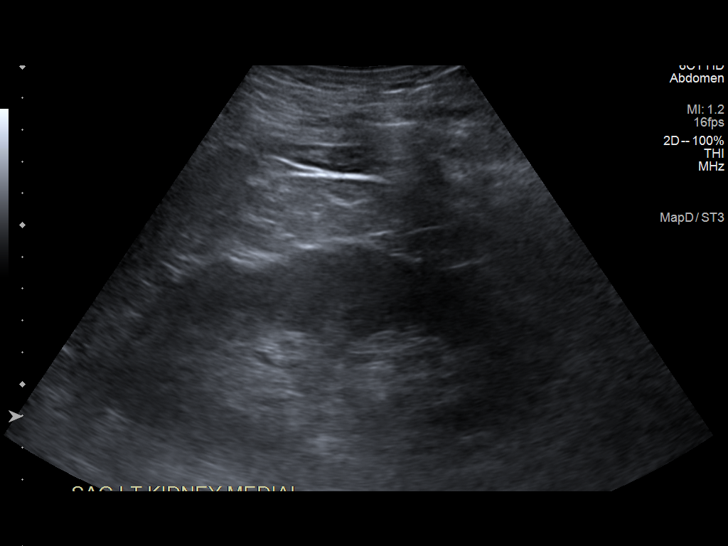
[im 107/117]
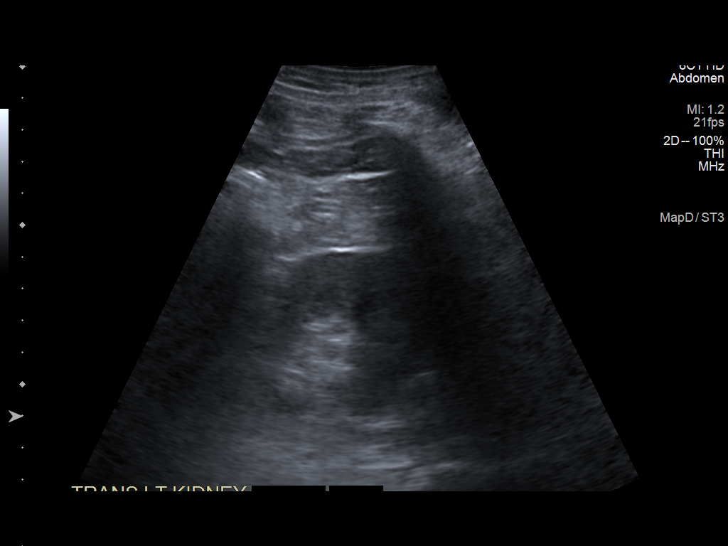
[im 117/117]
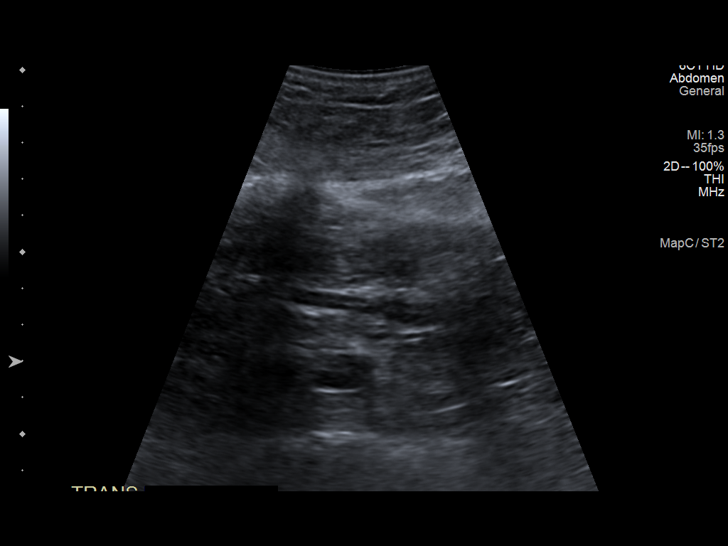

[14 of 25 positions shown; findings below may reference images not displayed]

FINDINGS: Evaluation is limited due to overlying bowel gas.

Gallbladder: No gallstones or wall thickening visualized. No
sonographic Murphy sign noted by sonographer.

Common bile duct: Diameter: 3 mm

Liver: There is diffuse increased liver echogenicity most commonly
seen in the setting of fatty infiltration. Superimposed inflammation
or fibrosis is not excluded. Clinical correlation is recommended.
Portal vein is patent on color Doppler imaging with normal direction
of blood flow towards the liver.

IVC: No abnormality visualized.

Pancreas: The pancreas is poorly visualized and obscured by bowel
gas.

Spleen: Size and appearance within normal limits.

Right Kidney: Length: 12.4 cm. Normal echogenicity. No
hydronephrosis or shadowing stone. There is a 2 cm interpolar cyst.

Left Kidney: Length: 13.3 cm. Normal echogenicity. No hydronephrosis
or shadowing stone.

Abdominal aorta: No aneurysm visualized.

Other findings: None.
IMPRESSION: 1. Fatty liver.
2. A 2 cm right renal interpolar cyst.

## 2022-12-13 ENCOUNTER — Telehealth: Payer: Self-pay | Admitting: Family

## 2022-12-13 ENCOUNTER — Other Ambulatory Visit: Payer: Self-pay | Admitting: Family Medicine

## 2022-12-13 NOTE — Telephone Encounter (Signed)
Prescription Request  12/13/2022  Is this a "Controlled Substance" medicine? No  LOV: 11/23/2022  What is the name of the medication or equipment?   rosuvastatin (CRESTOR) 10 MG tablet [[id number]]   lisinopril (ZESTRIL) 5 MG tablet [[id number]]  ENDED   Have you contacted your pharmacy to request a refill? Yes   Which pharmacy would you like this sent to?   [facility] - [place], [place] - [address] [address] Phone: [phone] Fax: [phone]  Patient notified that their request is being sent to the clinical staff for review and that they should receive a response within 2 business days.   Please advise at Mobile [phone] (mobile)

## 2022-12-14 ENCOUNTER — Other Ambulatory Visit: Payer: Self-pay

## 2022-12-14 MED ORDER — LISINOPRIL 5 MG PO TABS: 5.0000 mg | ORAL_TABLET | Freq: Every day | ORAL | 3 refills | Status: DC

## 2022-12-14 MED ORDER — ROSUVASTATIN CALCIUM 10 MG PO TABS: ORAL_TABLET | ORAL | 0 refills | Status: DC

## 2022-12-14 NOTE — Telephone Encounter (Signed)
Refills sent

## 2022-12-16 DIAGNOSIS — K766 Portal hypertension: Secondary | ICD-10-CM | POA: Insufficient documentation

## 2022-12-20 LAB — HM DIABETES EYE EXAM

## 2023-03-18 ENCOUNTER — Other Ambulatory Visit: Payer: Self-pay | Admitting: Family

## 2023-04-02 ENCOUNTER — Other Ambulatory Visit: Payer: Self-pay | Admitting: Family

## 2023-04-05 ENCOUNTER — Other Ambulatory Visit: Payer: Self-pay | Admitting: Family

## 2023-04-06 ENCOUNTER — Telehealth: Payer: Commercial Managed Care - PPO

## 2023-04-06 NOTE — Telephone Encounter (Signed)
Drug/Service Name: OZEMPIC 1 MG/DOSE (4 MG/3 ML) Physician/Nurse: Ria Clock EOC ID: [id number] Status: Canceled Date Requested: 04/06/2023 08:33:42 Date Closed: 04/06/2023 09:26:58 Dispensing Location: Retail Pharmacy   Est.Time of Completion: N/A

## 2023-04-06 NOTE — Telephone Encounter (Signed)
PA initiated at [email or web address], EOC ID: [id number]. Awaiting determination.

## 2023-04-11 ENCOUNTER — Encounter: Payer: Self-pay | Admitting: Family

## 2023-04-21 ENCOUNTER — Other Ambulatory Visit: Payer: Self-pay | Admitting: Nurse Practitioner

## 2023-04-21 ENCOUNTER — Encounter: Payer: Self-pay | Admitting: Family

## 2023-04-21 DIAGNOSIS — K766 Portal hypertension: Secondary | ICD-10-CM

## 2023-04-21 DIAGNOSIS — K7581 Nonalcoholic steatohepatitis (NASH): Secondary | ICD-10-CM

## 2023-04-21 DIAGNOSIS — K7460 Unspecified cirrhosis of liver: Secondary | ICD-10-CM

## 2023-05-03 MED ORDER — ESCITALOPRAM OXALATE 20 MG PO TABS: 20.0000 mg | ORAL_TABLET | Freq: Every day | ORAL | 1 refills | Status: DC

## 2023-05-03 NOTE — Telephone Encounter (Signed)
Vilas,   I have sent in the refill. You will need to talk to your pharmacist about options since you just filled the prescription.   Also, how are you feeling on the lower dosage of Ozempic?   Thanks, Vernona Rieger

## 2023-05-11 ENCOUNTER — Ambulatory Visit
Admission: RE | Admit: 2023-05-11 | Discharge: 2023-05-11 | Disposition: A | Discharge status: Home / Self Care | Payer: Commercial Managed Care - PPO | Source: Ambulatory Visit | Attending: Nurse Practitioner | Admitting: Nurse Practitioner

## 2023-05-11 DIAGNOSIS — K7460 Unspecified cirrhosis of liver: Secondary | ICD-10-CM

## 2023-05-11 DIAGNOSIS — K766 Portal hypertension: Secondary | ICD-10-CM

## 2023-05-11 DIAGNOSIS — K7581 Nonalcoholic steatohepatitis (NASH): Secondary | ICD-10-CM

## 2023-05-22 ENCOUNTER — Other Ambulatory Visit: Payer: Self-pay | Admitting: Family

## 2023-05-23 ENCOUNTER — Encounter: Payer: Self-pay | Admitting: *Deleted

## 2023-06-07 ENCOUNTER — Other Ambulatory Visit: Payer: Self-pay | Admitting: Family

## 2023-06-07 ENCOUNTER — Encounter: Payer: Self-pay | Admitting: Family

## 2023-06-07 ENCOUNTER — Ambulatory Visit: Payer: Commercial Managed Care - PPO | Admitting: Family

## 2023-06-07 VITALS — BP 136/78 | HR 60 | Ht 70.0 in | Wt 219.4 lb

## 2023-06-07 DIAGNOSIS — Z794 Long term (current) use of insulin: Secondary | ICD-10-CM

## 2023-06-07 DIAGNOSIS — Z0000 Encounter for general adult medical examination without abnormal findings: Secondary | ICD-10-CM

## 2023-06-07 DIAGNOSIS — R7989 Other specified abnormal findings of blood chemistry: Secondary | ICD-10-CM

## 2023-06-07 DIAGNOSIS — E119 Type 2 diabetes mellitus without complications: Secondary | ICD-10-CM

## 2023-06-07 DIAGNOSIS — Z125 Encounter for screening for malignant neoplasm of prostate: Secondary | ICD-10-CM

## 2023-06-07 DIAGNOSIS — Z13220 Encounter for screening for lipoid disorders: Secondary | ICD-10-CM

## 2023-06-07 LAB — COMPREHENSIVE METABOLIC PANEL
ALT: 108 U/L — ABNORMAL HIGH (ref 0–53)
AST: 75 U/L — ABNORMAL HIGH (ref 0–37)
Albumin: 4.7 g/dL (ref 3.5–5.2)
Alkaline Phosphatase: 63 U/L (ref 39–117)
BUN: 13 mg/dL (ref 6–23)
CO2: 29 mEq/L (ref 19–32)
Calcium: 10.7 mg/dL — ABNORMAL HIGH (ref 8.4–10.5)
Chloride: 100 mEq/L (ref 96–112)
Creatinine, Ser: 0.88 mg/dL (ref 0.40–1.50)
GFR: 98.28 mL/min (ref >60.00)
Glucose, Bld: 141 mg/dL — ABNORMAL HIGH (ref 70–99)
Potassium: 5.2 mEq/L — ABNORMAL HIGH (ref 3.5–5.1)
Sodium: 137 mEq/L (ref 135–145)
Total Bilirubin: 0.9 mg/dL (ref 0.2–1.2)
Total Protein: 8.5 g/dL — ABNORMAL HIGH (ref 6.0–8.3)

## 2023-06-07 LAB — CBC WITH DIFFERENTIAL/PLATELET
Basophils Absolute: 0 10*3/uL (ref 0.0–0.1)
Basophils Relative: 0.5 % (ref 0.0–3.0)
Eosinophils Absolute: 0.6 10*3/uL (ref 0.0–0.7)
Eosinophils Relative: 8.5 % — ABNORMAL HIGH (ref 0.0–5.0)
HCT: 41.7 % (ref 39.0–52.0)
Hemoglobin: 14.2 g/dL (ref 13.0–17.0)
Lymphocytes Relative: 32 % (ref 12.0–46.0)
Lymphs Abs: 2.3 10*3/uL (ref 0.7–4.0)
MCHC: 34.1 g/dL (ref 30.0–36.0)
MCV: 90.1 fl (ref 78.0–100.0)
Monocytes Absolute: 0.7 10*3/uL (ref 0.1–1.0)
Monocytes Relative: 9.3 % (ref 3.0–12.0)
Neutro Abs: 3.6 10*3/uL (ref 1.4–7.7)
Neutrophils Relative %: 49.7 % (ref 43.0–77.0)
Platelets: 261 10*3/uL (ref 150.0–400.0)
RBC: 4.63 Mil/uL (ref 4.22–5.81)
RDW: 12.8 % (ref 11.5–15.5)
WBC: 7.3 10*3/uL (ref 4.0–10.5)

## 2023-06-07 LAB — VITAMIN B12: Vitamin B-12: 688 pg/mL (ref 211–911)

## 2023-06-07 LAB — LIPID PANEL
Cholesterol: 137 mg/dL (ref 0–200)
HDL: 42 mg/dL (ref >39.00)
LDL Cholesterol: 76 mg/dL (ref 0–99)
NonHDL: 95.3
Total CHOL/HDL Ratio: 3
Triglycerides: 98 mg/dL (ref 0.0–149.0)
VLDL: 19.6 mg/dL (ref 0.0–40.0)

## 2023-06-07 LAB — HEMOGLOBIN A1C: Hgb A1c MFr Bld: 8.3 % — ABNORMAL HIGH (ref 4.6–6.5)

## 2023-06-07 LAB — PSA: PSA: 0.64 ng/mL (ref 0.10–4.00)

## 2023-06-07 MED ORDER — SEMAGLUTIDE (2 MG/DOSE) 8 MG/3ML ~~LOC~~ SOPN: 2.0000 mg | PEN_INJECTOR | SUBCUTANEOUS | 2 refills | Status: DC

## 2023-06-07 MED ORDER — TRAZODONE HCL 50 MG PO TABS: 25.0000 mg | ORAL_TABLET | Freq: Every evening | ORAL | 3 refills | Status: DC | PRN

## 2023-06-07 NOTE — Progress Notes (Signed)
Nathaniel Cox is a 52 y.o. male with the following history as recorded in EpicCare:  Patient Active Problem List   Diagnosis Date Noted   Screening for colon cancer 10/08/2022   Gastric erythema 10/08/2022   Cecal polyp 10/08/2022   Adenomatous polyp of transverse colon 10/08/2022   Rectal polyp 10/08/2022   Hypogonadism in male 11/19/2021   Acanthosis nigricans 08/07/2021   Essential hypertension 08/07/2021   Uncontrolled type 2 diabetes mellitus with hyperglycemia ([facility]) 08/07/2021   Swelling of finger of right hand 12/02/2017   Gastrocnemius muscle strain, left, initial encounter 10/26/2016   Combined arterial insufficiency and corporo-venous occlusive erectile dysfunction 04/28/2016   Onychocryptosis 05/27/2015   Depression 10/22/2011   Hemochromatosis 10/22/2011   History of constipation 10/22/2011   Hyperlipidemia 10/22/2011   Microalbuminuria 10/22/2011   NASH (nonalcoholic steatohepatitis) 10/22/2011   OSA (obstructive sleep apnea) 10/22/2011   Type 2 diabetes mellitus without complication, with long-term current use of insulin ([facility]) 10/22/2011    Current Outpatient Medications  Medication Sig Dispense Refill   escitalopram (LEXAPRO) 20 MG tablet Take 1 tablet (20 mg total) by mouth daily. 90 tablet 1   glucose blood test strip Use as instructed to check blood sugar 100 each 12   lisinopril (ZESTRIL) 5 MG tablet Take 1 tablet (5 mg total) by mouth daily. 90 tablet 3   OZEMPIC, 1 MG/DOSE, 4 MG/3ML SOPN INJECT 1 MG UNDER THE SKIN ONCE A WEEK 3 mL 0   rosuvastatin (CRESTOR) 10 MG tablet TAKE ONE TABLET BY MOUTH EVERY DAY 90 tablet 0   sildenafil (REVATIO) 20 MG tablet Take 3 to 4 tablets by mouth daily as directed 10 tablet 2   tadalafil (CIALIS) 20 MG tablet Take 1 tablet (20 mg total) by mouth daily as needed for erectile dysfunction. 6 tablet 11   traZODone (DESYREL) 50 MG tablet Take 0.5-1 tablets (25-50 mg total) by mouth at bedtime as needed for sleep. 30 tablet 3    metFORMIN (GLUCOPHAGE) 1000 MG tablet Take 1 tablet (1,000 mg total) by mouth 2 (two) times daily with a meal. 180 tablet 3   No current facility-administered medications for this visit.    Allergies: Atorvastatin  Past Medical History:  Diagnosis Date   Cirrhosis ([facility])    Combined arterial insufficiency and corporo-venous occlusive erectile dysfunction 04/28/2016   Depression 10/22/2011   Essential hypertension 08/07/2021   Gastrocnemius muscle strain, left, initial encounter 10/26/2016   Hemochromatosis 10/22/2011   Formatting of this note might be different from the original. Managed by [facility]; diagnosis has been questioned as LFTs have normalized without need for continued blood draws   History of constipation 10/22/2011   Hyperlipidemia    Hypertension    Hypogonadism in male 11/19/2021   Microalbuminuria 10/22/2011   NASH (nonalcoholic steatohepatitis) 10/22/2011   Formatting of this note might be different from the original. Followed by [facility]   Onychocryptosis 05/27/2015   OSA (obstructive sleep apnea) 10/22/2011   Formatting of this note might be different from the original. Apr 25, 2013:  Polysomnography study at [facility] revealed mild obstructive sleep apnea with an overall index of 14 events per our.  Oxygen saturation minimum 77%.  Epworth sleepiness Scale score:  22/24.  BMI:  34. Neck size:  16.5 in.   Swelling of finger of right hand 12/02/2017   Type 2 diabetes mellitus without complication, with long-term current use of insulin ([facility]) 10/22/2011   Uncontrolled type 2 diabetes mellitus with hyperglycemia ([facility])  08/07/2021    Past Surgical History:  Procedure Laterality Date   COLONOSCOPY WITH PROPOFOL N/A 10/08/2022   Procedure: COLONOSCOPY WITH PROPOFOL;  Surgeon: Toney Reil, MD;  Location: [facility] END[facility]COPY;  Service: Gastroenterology;  Laterality: N/A;   HAND SURGERY Right     Family History  Problem Relation Age of Onset   Liver cancer Mother     Liver disease Mother    Lung cancer Mother    Hyperlipidemia Father    Hypertension Father    Coronary artery disease Father    Coronary artery disease Brother     Social History   Tobacco Use   Smoking status: Never   Smokeless tobacco: Never  Substance Use Topics   Alcohol use: Never    Subjective:   Presents today for yearly CPE; is continuing to work with liver specialist every 3 months; is currently taking Ozempic and Metformin for diabetes control- still concerned that diabetes is not well controlled; also notes has been feeling more tired recently- lost his mother in April/ does not feel like he is resting well at night;  Review of Systems  Constitutional: Negative.   HENT: Negative.    Eyes: Negative.   Respiratory: Negative.    Cardiovascular: Negative.   Gastrointestinal: Negative.   Genitourinary: Negative.   Musculoskeletal: Negative.   Skin: Negative.   Neurological: Negative.   Endo/Heme/Allergies: Negative.   Psychiatric/Behavioral:  The patient has insomnia.        Objective:  Vitals:   06/07/23 0856  BP: 136/78  Pulse: 60  SpO2: 98%  Weight: 219 lb 6.4 oz (99.5 kg)  Height: 5\' 10"  (1.778 m)    General: Well developed, well nourished, in no acute distress  Skin : Warm and dry.  Head: Normocephalic and atraumatic  Eyes: Sclera and conjunctiva clear; pupils round and reactive to light; extraocular movements intact  Ears: External normal; canals clear; tympanic membranes normal  Oropharynx: Pink, supple. No suspicious lesions  Neck: Supple without thyromegaly, adenopathy  Lungs: Respirations unlabored; clear to auscultation bilaterally without wheeze, rales, rhonchi  CVS exam: normal rate and regular rhythm.  Abdomen: Soft; nontender; nondistended; normoactive bowel sounds; no masses or hepatosplenomegaly  Musculoskeletal: No deformities; no active joint inflammation  Extremities: No edema, cyanosis, clubbing  Vessels: Symmetric bilaterally   Neurologic: Alert and oriented; speech intact; face symmetrical; moves all extremities well; CNII-XII intact without focal deficit   Assessment:  1. PE (physical exam), annual   2. Type 2 diabetes mellitus without complication, with long-term current use of insulin ([facility])   3. Lipid screening   4. Prostate cancer screening   5. Low vitamin B12 level     Plan:  Age appropriate preventive healthcare needs addressed; encouraged regular eye doctor and dental exams; encouraged regular exercise; will update labs and refills as needed today; follow-up to be determined; Trial of Trazodone to help with situational anxiety;   No follow-ups on file.  Orders Placed This Encounter  Procedures   CBC with Differential/Platelet   Comp Met (CMET)   Hemoglobin A1c   Lipid panel   PSA   B12    Requested Prescriptions   Signed Prescriptions Disp Refills   traZODone (DESYREL) 50 MG tablet 30 tablet 3    Sig: Take 0.5-1 tablets (25-50 mg total) by mouth at bedtime as needed for sleep.

## 2023-06-08 ENCOUNTER — Other Ambulatory Visit: Payer: Self-pay | Admitting: Family

## 2023-06-08 ENCOUNTER — Encounter: Payer: Self-pay | Admitting: Family

## 2023-06-08 DIAGNOSIS — R899 Unspecified abnormal finding in specimens from other organs, systems and tissues: Secondary | ICD-10-CM

## 2023-06-08 NOTE — Telephone Encounter (Signed)
Pt spoke with Leonette Most and was provided the phone number to [facility] to call and get his lab appointment set up. Pt expressed understanding and stated he would call and make the appointment.

## 2023-06-08 NOTE — Telephone Encounter (Signed)
Called pt and left a VM asking pt to call the office back.  

## 2023-06-08 NOTE — Telephone Encounter (Signed)
  Can you call him and let him know he can go to [place] office to get potassium re-checked. Order in place- just need him to call and schedule his lab appointment please.     Yes, [facility] is fine with him going there. He will need to be scheduled on their lab schedule. May be easier for him to just call them to get scheduled. [phone].

## 2023-06-14 ENCOUNTER — Other Ambulatory Visit: Payer: Self-pay | Admitting: Family

## 2023-06-21 ENCOUNTER — Other Ambulatory Visit: Payer: Self-pay | Admitting: Family

## 2023-08-25 ENCOUNTER — Other Ambulatory Visit: Payer: Self-pay | Admitting: Family

## 2023-09-12 ENCOUNTER — Other Ambulatory Visit: Payer: Self-pay | Admitting: Family

## 2023-09-13 ENCOUNTER — Encounter: Payer: Self-pay | Admitting: Family

## 2023-09-19 ENCOUNTER — Other Ambulatory Visit (INDEPENDENT_AMBULATORY_CARE_PROVIDER_SITE_OTHER): Payer: Commercial Managed Care - PPO

## 2023-09-19 DIAGNOSIS — R899 Unspecified abnormal finding in specimens from other organs, systems and tissues: Secondary | ICD-10-CM | POA: Diagnosis not present

## 2023-09-19 LAB — BASIC METABOLIC PANEL
BUN: 14 mg/dL (ref 6–23)
CO2: 26 meq/L (ref 19–32)
Calcium: 10 mg/dL (ref 8.4–10.5)
Chloride: 100 meq/L (ref 96–112)
Creatinine, Ser: 0.9 mg/dL (ref 0.40–1.50)
GFR: 97.42 mL/min (ref >60.00)
Glucose, Bld: 180 mg/dL — ABNORMAL HIGH (ref 70–99)
Potassium: 4 meq/L (ref 3.5–5.1)
Sodium: 134 meq/L — ABNORMAL LOW (ref 135–145)

## 2023-09-20 ENCOUNTER — Other Ambulatory Visit: Payer: Self-pay | Admitting: Family

## 2023-09-20 DIAGNOSIS — E119 Type 2 diabetes mellitus without complications: Secondary | ICD-10-CM

## 2023-09-20 MED ORDER — METFORMIN HCL 1000 MG PO TABS: 1000.0000 mg | ORAL_TABLET | Freq: Two times a day (BID) | ORAL | 3 refills | Status: DC

## 2023-09-22 ENCOUNTER — Other Ambulatory Visit: Payer: Self-pay | Admitting: Family

## 2023-09-26 ENCOUNTER — Other Ambulatory Visit: Payer: Self-pay | Admitting: Family

## 2023-10-20 ENCOUNTER — Telehealth: Payer: Self-pay

## 2023-10-20 NOTE — Telephone Encounter (Signed)
PA approved.   Request Reference Number: [id number]. OZEMPIC INJ 8MG /3ML is approved through 10/19/2024. Your patient may now fill this prescription and it will be covered

## 2023-10-20 NOTE — Telephone Encounter (Signed)
PA initiated via Covermymeds;KEY: [id number]. Awaiting determination.

## 2023-10-21 ENCOUNTER — Other Ambulatory Visit: Payer: Self-pay | Admitting: Family

## 2023-10-21 ENCOUNTER — Telehealth: Payer: Self-pay | Admitting: Family

## 2023-10-21 MED ORDER — OZEMPIC (2 MG/DOSE) 8 MG/3ML ~~LOC~~ SOPN: PEN_INJECTOR | SUBCUTANEOUS | 1 refills | Status: DC

## 2023-10-21 NOTE — Telephone Encounter (Signed)
Pt changed insurance (new card under [employer]) and stated a prior auth is needed for metformin, sildenafil, and ozempic under [employer]. Please initiate PA's.

## 2023-10-24 NOTE — Telephone Encounter (Signed)
Noted  

## 2023-12-13 ENCOUNTER — Other Ambulatory Visit: Payer: Self-pay | Admitting: Family

## 2023-12-21 ENCOUNTER — Other Ambulatory Visit: Payer: Self-pay | Admitting: Family

## 2023-12-23 ENCOUNTER — Ambulatory Visit (HOSPITAL_BASED_OUTPATIENT_CLINIC_OR_DEPARTMENT_OTHER)
Admission: RE | Admit: 2023-12-23 | Discharge: 2023-12-23 | Disposition: A | Discharge status: Home / Self Care | Payer: Managed Care, Other (non HMO) | Source: Ambulatory Visit | Attending: Family | Admitting: Family

## 2023-12-23 ENCOUNTER — Ambulatory Visit (INDEPENDENT_AMBULATORY_CARE_PROVIDER_SITE_OTHER): Payer: Managed Care, Other (non HMO) | Admitting: Family

## 2023-12-23 VITALS — BP 134/82 | HR 67 | Temp 97.9°F | Ht 70.0 in | Wt 222.8 lb

## 2023-12-23 DIAGNOSIS — E785 Hyperlipidemia, unspecified: Secondary | ICD-10-CM

## 2023-12-23 DIAGNOSIS — E119 Type 2 diabetes mellitus without complications: Secondary | ICD-10-CM

## 2023-12-23 DIAGNOSIS — R042 Hemoptysis: Secondary | ICD-10-CM | POA: Diagnosis present

## 2023-12-23 DIAGNOSIS — R109 Unspecified abdominal pain: Secondary | ICD-10-CM

## 2023-12-23 DIAGNOSIS — R197 Diarrhea, unspecified: Secondary | ICD-10-CM

## 2023-12-23 DIAGNOSIS — Z794 Long term (current) use of insulin: Secondary | ICD-10-CM

## 2023-12-23 MED ORDER — ROSUVASTATIN CALCIUM 10 MG PO TABS: 10.0000 mg | ORAL_TABLET | Freq: Every day | ORAL | 1 refills | Status: DC

## 2023-12-23 MED ORDER — METFORMIN HCL 1000 MG PO TABS: 1000.0000 mg | ORAL_TABLET | Freq: Two times a day (BID) | ORAL | 3 refills | Status: AC

## 2023-12-23 MED ORDER — SILDENAFIL CITRATE 20 MG PO TABS: ORAL_TABLET | ORAL | 2 refills | Status: DC

## 2023-12-23 MED ORDER — LISINOPRIL 5 MG PO TABS: 5.0000 mg | ORAL_TABLET | Freq: Every day | ORAL | 3 refills | Status: DC

## 2023-12-23 NOTE — Progress Notes (Signed)
Nathaniel Cox is a 53 y.o. male with the following history as recorded in Epic[employer]:  Patient Active Problem List   Diagnosis Date Noted   Screening for colon cancer 10/08/2022   Gastric erythema 10/08/2022   Cecal polyp 10/08/2022   Adenomatous polyp of transverse colon 10/08/2022   Rectal polyp 10/08/2022   Hypogonadism in male 11/19/2021   Acanthosis nigricans 08/07/2021   Essential hypertension 08/07/2021   Uncontrolled type 2 diabetes mellitus with hyperglycemia (HCC) 08/07/2021   Swelling of finger of right hand 12/02/2017   Gastrocnemius muscle strain, left, initial encounter 10/26/2016   Combined arterial insufficiency and corporo-venous occlusive erectile dysfunction 04/28/2016   Onychocryptosis 05/27/2015   Depression 10/22/2011   Hemochromatosis 10/22/2011   History of constipation 10/22/2011   Hyperlipidemia 10/22/2011   Microalbuminuria 10/22/2011   NASH (nonalcoholic steatohepatitis) 10/22/2011   OSA (obstructive sleep apnea) 10/22/2011   Type 2 diabetes mellitus without complication, with long-term current use of insulin (HCC) 10/22/2011    Current Outpatient Medications  Medication Sig Dispense Refill   escitalopram (LEXAPRO) 20 MG tablet TAKE 1 TABLET(20 MG) BY MOUTH DAILY 90 tablet 1   glucose blood test strip Use as instructed to check blood sugar 100 each 12   Semaglutide, 2 MG/DOSE, (OZEMPIC, 2 MG/DOSE,) 8 MG/3ML SOPN Inject 2 mg into the skin once a week. 3 mL 0   lisinopril (ZESTRIL) 5 MG tablet Take 1 tablet (5 mg total) by mouth daily. 90 tablet 3   metFORMIN (GLUCOPHAGE) 1000 MG tablet Take 1 tablet (1,000 mg total) by mouth 2 (two) times daily with a meal. 180 tablet 3   rosuvastatin (CRESTOR) 10 MG tablet Take 1 tablet (10 mg total) by mouth daily. 90 tablet 1   sildenafil (REVATIO) 20 MG tablet Take 3 to 4 tablets by mouth daily as directed 40 tablet 2   No current facility-administered medications for this visit.    Allergies: Atorvastatin  Past  Medical History:  Diagnosis Date   Cirrhosis (HCC)    Combined arterial insufficiency and corporo-venous occlusive erectile dysfunction 04/28/2016   Depression 10/22/2011   Essential hypertension 08/07/2021   Gastrocnemius muscle strain, left, initial encounter 10/26/2016   Hemochromatosis 10/22/2011   Formatting of this note might be different from the original. Managed by [facility]; diagnosis has been questioned as LFTs have normalized without need for continued blood draws   History of constipation 10/22/2011   Hyperlipidemia    Hypertension    Hypogonadism in male 11/19/2021   Microalbuminuria 10/22/2011   NASH (nonalcoholic steatohepatitis) 10/22/2011   Formatting of this note might be different from the original. Followed by [facility]   Onychocryptosis 05/27/2015   OSA (obstructive sleep apnea) 10/22/2011   Formatting of this note might be different from the original. Apr 25, 2013:  Polysomnography study at [facility] revealed mild obstructive sleep apnea with an overall index of 14 events per our.  Oxygen saturation minimum 77%.  Epworth sleepiness Scale score:  22/24.  BMI:  34. Neck size:  16.5 in.   Swelling of finger of right hand 12/02/2017   Type 2 diabetes mellitus without complication, with long-term current use of insulin (HCC) 10/22/2011   Uncontrolled type 2 diabetes mellitus with hyperglycemia (HCC) 08/07/2021    Past Surgical History:  Procedure Laterality Date   COLONOSCOPY WITH PROPOFOL N/A 10/08/2022   Procedure: COLONOSCOPY WITH PROPOFOL;  Surgeon: Toney Reil, MD;  Location: [facility] ENDOSCOPY;  Service: Gastroenterology;  Laterality: N/A;   HAND SURGERY Right  Family History  Problem Relation Age of Onset   Liver cancer Mother    Liver disease Mother    Lung cancer Mother    Hyperlipidemia Father    Hypertension Father    Coronary artery disease Father    Coronary artery disease Brother     Social History   Tobacco Use   Smoking status:  Never   Smokeless tobacco: Never  Substance Use Topics   Alcohol use: Never    Subjective:   Patient notes that on Christmas day he had an upset stomach along with diarrhea/ "sour burps." He notes that he has finally started to feel better in the past 2-3 days; drank coffee/ ate a steak burrito this morning with no issues; he did continue to take his Ozempic regularly up until last week- has not taken since last Tuesday; no blood in the stool; continuing to work with his liver specialist;   Notes he has not been on Metformin "for months." Requesting updated refill;  He notes that he coughed this morning and did cough up blood/ describes as mucus "with good amount of blood;" no fever, chest pain or shortness of breath;    Objective:  Vitals:   12/23/23 1416  BP: 134/82  Pulse: 67  Temp: 97.9 F (36.6 C)  TempSrc: Oral  SpO2: 97%  Weight: 222 lb 12.8 oz (101.1 kg)  Height: 5\' 10"  (1.778 m)    General: Well developed, well nourished, in no acute distress  Skin : Warm and dry.  Head: Normocephalic and atraumatic  Eyes: Sclera and conjunctiva clear; pupils round and reactive to light; extraocular movements intact  Ears: External normal; canals clear; tympanic membranes normal  Oropharynx: Pink, supple. No suspicious lesions  Neck: Supple without thyromegaly, adenopathy  Lungs: Respirations unlabored; clear to auscultation bilaterally without wheeze, rales, rhonchi  CVS exam: normal rate and regular rhythm.  Abdomen: Soft; nontender; nondistended; normoactive bowel sounds; no masses or hepatosplenomegaly  Musculoskeletal: No deformities; no active joint inflammation  Extremities: No edema, cyanosis, clubbing  Vessels: Symmetric bilaterally  Neurologic: Alert and oriented; speech intact; face symmetrical; moves all extremities well; CNII-XII intact without focal deficit   Assessment:  1. Hemoptysis   2. Type 2 diabetes mellitus without complication, with long-term current use of  insulin (HCC)   3. Hyperlipidemia, unspecified hyperlipidemia type   4. Abdominal pain, unspecified abdominal location   5. Diarrhea, unspecified type     Plan:  Physical exam is reassuring; will update CXR today; will need to consider having patient follow up with his GI; ? Control; patient has not been on his Metformin Rx "for months"- refill updated for Metformin; continue Ozempic as prescribed; update Hba1c today; Check lipid panel; ? If symptoms patient experienced at Christmas ( approximately 3 weeks ago) were due to possible viral illness or eating large meals which is abnormal for him; his physical exam is reassuring today; check amylase, lipase;   No follow-ups on file.  Orders Placed This Encounter  Procedures   DG Chest 2 View    Standing Status:   Future    Expiration Date:   12/22/2024    Reason for Exam (SYMPTOM  OR DIAGNOSIS REQUIRED):   hemoptysis    Preferred imaging location?:   [facility]   Lipid panel   CBC with Differential/Platelet   Comp Met (CMET)   Iron, TIBC and Ferritin Panel   Hemoglobin A1c   Amylase   Lipase    Requested Prescriptions   Signed  Prescriptions Disp Refills   metFORMIN (GLUCOPHAGE) 1000 MG tablet 180 tablet 3    Sig: Take 1 tablet (1,000 mg total) by mouth 2 (two) times daily with a meal.   lisinopril (ZESTRIL) 5 MG tablet 90 tablet 3    Sig: Take 1 tablet (5 mg total) by mouth daily.   sildenafil (REVATIO) 20 MG tablet 40 tablet 2    Sig: Take 3 to 4 tablets by mouth daily as directed   rosuvastatin (CRESTOR) 10 MG tablet 90 tablet 1    Sig: Take 1 tablet (10 mg total) by mouth daily.

## 2023-12-24 LAB — CBC WITH DIFFERENTIAL/PLATELET
Basophils Absolute: 0 10*3/uL (ref 0.0–0.2)
Basos: 1 %
EOS (ABSOLUTE): 0.7 10*3/uL — ABNORMAL HIGH (ref 0.0–0.4)
Eos: 9 %
Hematocrit: 43.7 % (ref 37.5–51.0)
Hemoglobin: 14.9 g/dL (ref 13.0–17.7)
Immature Grans (Abs): 0 10*3/uL (ref 0.0–0.1)
Immature Granulocytes: 0 %
Lymphocytes Absolute: 2.6 10*3/uL (ref 0.7–3.1)
Lymphs: 34 %
MCH: 31.4 pg (ref 26.6–33.0)
MCHC: 34.1 g/dL (ref 31.5–35.7)
MCV: 92 fL (ref 79–97)
Monocytes Absolute: 0.6 10*3/uL (ref 0.1–0.9)
Monocytes: 7 %
Neutrophils Absolute: 3.8 10*3/uL (ref 1.4–7.0)
Neutrophils: 49 %
Platelets: 249 10*3/uL (ref 150–450)
RBC: 4.75 x10E6/uL (ref 4.14–5.80)
RDW: 12.1 % (ref 11.6–15.4)
WBC: 7.8 10*3/uL (ref 3.4–10.8)

## 2023-12-24 LAB — IRON,TIBC AND FERRITIN PANEL
Ferritin: 551 ng/mL — ABNORMAL HIGH (ref 30–400)
Iron Saturation: 60 % — ABNORMAL HIGH (ref 15–55)
Iron: 189 ug/dL — ABNORMAL HIGH (ref 38–169)
Total Iron Binding Capacity: 313 ug/dL (ref 250–450)
UIBC: 124 ug/dL (ref 111–343)

## 2023-12-24 LAB — LIPID PANEL
Chol/HDL Ratio: 4.1 {ratio} (ref 0.0–5.0)
Cholesterol, Total: 148 mg/dL (ref 100–199)
HDL: 36 mg/dL — ABNORMAL LOW (ref >39)
LDL Chol Calc (NIH): 84 mg/dL (ref 0–99)
Triglycerides: 158 mg/dL — ABNORMAL HIGH (ref 0–149)
VLDL Cholesterol Cal: 28 mg/dL (ref 5–40)

## 2023-12-24 LAB — COMPREHENSIVE METABOLIC PANEL
ALT: 131 [IU]/L — ABNORMAL HIGH (ref 0–44)
AST: 87 [IU]/L — ABNORMAL HIGH (ref 0–40)
Albumin: 4.4 g/dL (ref 3.8–4.9)
Alkaline Phosphatase: 91 [IU]/L (ref 44–121)
BUN/Creatinine Ratio: 9 (ref 9–20)
BUN: 9 mg/dL (ref 6–24)
Bilirubin Total: 0.7 mg/dL (ref 0.0–1.2)
CO2: 24 mmol/L (ref 20–29)
Calcium: 10 mg/dL (ref 8.7–10.2)
Chloride: 100 mmol/L (ref 96–106)
Creatinine, Ser: 0.98 mg/dL (ref 0.76–1.27)
Globulin, Total: 3.5 g/dL (ref 1.5–4.5)
Glucose: 224 mg/dL — ABNORMAL HIGH (ref 70–99)
Potassium: 4.5 mmol/L (ref 3.5–5.2)
Sodium: 140 mmol/L (ref 134–144)
Total Protein: 7.9 g/dL (ref 6.0–8.5)
eGFR: 92 mL/min/{1.73_m2} (ref >59)

## 2023-12-24 LAB — HEMOGLOBIN A1C
Est. average glucose Bld gHb Est-mCnc: 189 mg/dL
Hgb A1c MFr Bld: 8.2 % — ABNORMAL HIGH (ref 4.8–5.6)

## 2023-12-24 LAB — LIPASE: Lipase: 92 U/L — ABNORMAL HIGH (ref 13–78)

## 2023-12-24 LAB — AMYLASE: Amylase: 135 U/L — ABNORMAL HIGH (ref 31–110)

## 2023-12-26 LAB — HM DIABETES EYE EXAM

## 2023-12-26 NOTE — Telephone Encounter (Signed)
Error

## 2023-12-27 ENCOUNTER — Other Ambulatory Visit: Payer: Self-pay | Admitting: Family

## 2023-12-27 DIAGNOSIS — R1012 Left upper quadrant pain: Secondary | ICD-10-CM

## 2023-12-28 ENCOUNTER — Encounter: Payer: Self-pay | Admitting: Family

## 2023-12-28 ENCOUNTER — Ambulatory Visit
Admission: RE | Admit: 2023-12-28 | Discharge: 2023-12-28 | Disposition: A | Discharge status: Home / Self Care | Payer: Managed Care, Other (non HMO) | Source: Ambulatory Visit | Attending: Family | Admitting: Family

## 2023-12-28 ENCOUNTER — Other Ambulatory Visit: Payer: Self-pay | Admitting: Family

## 2023-12-28 DIAGNOSIS — R1012 Left upper quadrant pain: Secondary | ICD-10-CM

## 2023-12-28 DIAGNOSIS — R109 Unspecified abdominal pain: Secondary | ICD-10-CM

## 2023-12-28 MED ORDER — IOPAMIDOL (ISOVUE-370) INJECTION 76%
80.0000 mL | Freq: Once | INTRAVENOUS | Status: AC | PRN
  Administered 2023-12-28: 80 mL via INTRAVENOUS

## 2023-12-28 MED ORDER — INSULIN GLARGINE 100 UNITS/ML SOLOSTAR PEN: PEN_INJECTOR | SUBCUTANEOUS | 11 refills | Status: DC

## 2023-12-28 MED ORDER — INSULIN PEN NEEDLE 32G X 5 MM MISC: 3 refills | Status: DC

## 2023-12-30 NOTE — Progress Notes (Signed)
Mychart message has been sent to PCP.

## 2024-01-04 ENCOUNTER — Encounter: Payer: Self-pay | Admitting: Family

## 2024-01-04 ENCOUNTER — Other Ambulatory Visit: Payer: Self-pay | Admitting: Family

## 2024-01-04 ENCOUNTER — Ambulatory Visit
Admission: RE | Admit: 2024-01-04 | Discharge: 2024-01-04 | Disposition: A | Discharge status: Home / Self Care | Payer: Managed Care, Other (non HMO) | Source: Ambulatory Visit | Attending: Family | Admitting: Family

## 2024-01-04 DIAGNOSIS — R109 Unspecified abdominal pain: Secondary | ICD-10-CM

## 2024-01-04 DIAGNOSIS — K829 Disease of gallbladder, unspecified: Secondary | ICD-10-CM

## 2024-01-05 ENCOUNTER — Telehealth: Payer: Self-pay | Admitting: Surgery

## 2024-01-05 ENCOUNTER — Ambulatory Visit: Payer: Managed Care, Other (non HMO) | Admitting: Surgery

## 2024-01-05 ENCOUNTER — Ambulatory Visit: Payer: Self-pay | Admitting: Surgery

## 2024-01-05 ENCOUNTER — Encounter: Payer: Self-pay | Admitting: Surgery

## 2024-01-05 VITALS — BP 112/74 | HR 74 | Temp 98.1°F | Ht 70.0 in | Wt 212.6 lb

## 2024-01-05 DIAGNOSIS — K8010 Calculus of gallbladder with chronic cholecystitis without obstruction: Secondary | ICD-10-CM | POA: Diagnosis not present

## 2024-01-05 DIAGNOSIS — K7581 Nonalcoholic steatohepatitis (NASH): Secondary | ICD-10-CM

## 2024-01-05 HISTORY — DX: Calculus of gallbladder with chronic cholecystitis without obstruction: K80.10

## 2024-01-05 NOTE — Telephone Encounter (Signed)
Patient has been advised of Pre-Admission date/time, and Surgery date at [facility]  Surgery Date: 01/11/24 Preadmission Testing Date: 01/10/24 (phone 8a-1p)  Patient has been made aware to call [phone], between 1-3:00pm the day before surgery, to find out what time to arrive for surgery.

## 2024-01-05 NOTE — Patient Instructions (Signed)
You have requested to have your gallbladder removed. This will be done at [facility] with Dr. Claudine Mouton.  You will most likely be out of work 1-2 weeks for this surgery.  If you have FMLA or disability paperwork that needs filled out you may drop this off at our office or this can be faxed to [phone].  You will return after your post-op appointment with a lifting restriction for approximately 4 more weeks.  You will be able to eat anything you would like to following surgery. But, start by eating a bland diet and advance this as tolerated. The Gallbladder diet is below, please go as closely by this diet as possible prior to surgery to avoid any further attacks.  Please see the (blue)pre-care form that you have been given today. Our surgery scheduler will call you to verify surgery date and to go over information.   If you have any questions, please call our office.  Laparoscopic Cholecystectomy Laparoscopic cholecystectomy is surgery to remove the gallbladder. The gallbladder is located in the upper right part of the abdomen, behind the liver. It is a storage sac for bile, which is produced in the liver. Bile aids in the digestion and absorption of fats. Cholecystectomy is often done for inflammation of the gallbladder (cholecystitis). This condition is usually caused by a buildup of gallstones (cholelithiasis) in the gallbladder. Gallstones can block the flow of bile, and that can result in inflammation and pain. In severe cases, emergency surgery may be required. If emergency surgery is not required, you will have time to prepare for the procedure. Laparoscopic surgery is an alternative to open surgery. Laparoscopic surgery has a shorter recovery time. Your common bile duct may also need to be examined during the procedure. If stones are found in the common bile duct, they may be removed. LET [facility] CARE PROVIDER KNOW ABOUT: Any allergies you have. All medicines you are taking,  including vitamins, herbs, eye drops, creams, and over-the-counter medicines. Previous problems you or members of your family have had with the use of anesthetics. Any blood disorders you have. Previous surgeries you have had.  Any medical conditions you have. RISKS AND COMPLICATIONS Generally, this is a safe procedure. However, problems may occur, including: Infection. Bleeding. Allergic reactions to medicines. Damage to other structures or organs. A stone remaining in the common bile duct. A bile leak from the cyst duct that is clipped when your gallbladder is removed. The need to convert to open surgery, which requires a larger incision in the abdomen. This may be necessary if your surgeon thinks that it is not safe to continue with a laparoscopic procedure. BEFORE THE PROCEDURE Ask your health care provider about: Changing or stopping your regular medicines. This is especially important if you are taking diabetes medicines or blood thinners. Taking medicines such as aspirin and ibuprofen. These medicines can thin your blood. Do not take these medicines before your procedure if your health care provider instructs you not to. Follow instructions from your health care provider about eating or drinking restrictions. Let your health care provider know if you develop a cold or an infection before surgery. Plan to have someone take you home after the procedure. Ask your health care provider how your surgical site will be marked or identified. You may be given antibiotic medicine to help prevent infection. PROCEDURE To reduce your risk of infection: Your health care team will wash or sanitize their hands. Your skin will be washed with soap. An IV  tube may be inserted into one of your veins. You will be given a medicine to make you fall asleep (general anesthetic). A breathing tube will be placed in your mouth. The surgeon will make several small cuts (incisions) in your abdomen. A thin,  lighted tube (laparoscope) that has a tiny camera on the end will be inserted through one of the small incisions. The camera on the laparoscope will send a picture to a TV screen (monitor) in the operating room. This will give the surgeon a good view inside your abdomen. A gas will be pumped into your abdomen. This will expand your abdomen to give the surgeon more room to perform the surgery. Other tools that are needed for the procedure will be inserted through the other incisions. The gallbladder will be removed through one of the incisions. After your gallbladder has been removed, the incisions will be closed with stitches (sutures), staples, or skin glue. Your incisions may be covered with a bandage (dressing). The procedure may vary among health care providers and hospitals. AFTER THE PROCEDURE Your blood pressure, heart rate, breathing rate, and blood oxygen level will be monitored often until the medicines you were given have worn off. You will be given medicines as needed to control your pain.   This information is not intended to replace advice given to you by your health care provider. Make sure you discuss any questions you have with your health care provider.   Document Released: 11/22/2005 Document Revised: 08/13/2015 Document Reviewed: 07/04/2013 Elsevier Interactive Patient Education 2016 [employer]   Low-Fat Diet for Gallbladder Conditions A low-fat diet can be helpful if you have pancreatitis or a gallbladder condition. With these conditions, your pancreas and gallbladder have trouble digesting fats. A healthy eating plan with less fat will help rest your pancreas and gallbladder and reduce your symptoms. WHAT DO I NEED TO KNOW ABOUT THIS DIET? Eat a low-fat diet. Reduce your fat intake to less than 20-30% of your total daily calories. This is less than 50-60 g of fat per day. Remember that you need some fat in your diet. Ask your dietician what your daily goal should  be. Choose nonfat and low-fat healthy foods. Look for the words "nonfat," "low fat," or "fat free." As a guide, look on the label and choose foods with less than 3 g of fat per serving. Eat only one serving. Avoid alcohol. Do not smoke. If you need help quitting, talk with your health care provider. Eat small frequent meals instead of three large heavy meals. WHAT FOODS CAN I EAT? Grains Include healthy grains and starches such as potatoes, wheat bread, fiber-rich cereal, and brown rice. Choose whole grain options whenever possible. In adults, whole grains should account for 45-65% of your daily calories.  Fruits and Vegetables Eat plenty of fruits and vegetables. Fresh fruits and vegetables add fiber to your diet. Meats and Other Protein Sources Eat lean meat such as chicken and pork. Trim any fat off of meat before cooking it. Eggs, fish, and beans are other sources of protein. In adults, these foods should account for 10-35% of your daily calories. Dairy Choose low-fat milk and dairy options. Dairy includes fat and protein, as well as calcium.  Fats and Oils Limit high-fat foods such as fried foods, sweets, baked goods, sugary drinks.  Other Creamy sauces and condiments, such as mayonnaise, can add extra fat. Think about whether or not you need to use them, or use smaller amounts or low fat options.  WHAT FOODS ARE NOT RECOMMENDED? High fat foods, such as: [employer]. Ice cream. [place] toast. Sweet rolls. Pizza. Cheese bread. Foods covered with batter, butter, creamy sauces, or cheese. Fried foods. Sugary drinks and desserts. Foods that cause gas or bloating   This information is not intended to replace advice given to you by your health care provider. Make sure you discuss any questions you have with your health care provider.   Document Released: 11/27/2013 Document Reviewed: 11/27/2013 Elsevier Interactive Patient Education [employer]

## 2024-01-05 NOTE — Progress Notes (Signed)
Patient ID: Nathaniel Cox, male   DOB: September 26, 1971, 53 y.o.   MRN: [id number]  Chief Complaint: Postprandial abdominal pain  History of Present Illness Nathaniel Cox is a 53 y.o. male with nonlocalized postprandial abdominal pain.  Associated with liquid bowel movements, excessive gas and belching.  Associated nausea with diminished appetite.  Denies vomiting, fevers, chills.  Has history of nonalcoholic fatty liver disease/NASH/cirrhosis.  History of hemochromatosis.  No history of ascites, or bleeding issues.  Taking Ozempic for diabetes/weight control.  Known elevated transaminase.  No history of jaundice.  Past Medical History Past Medical History:  Diagnosis Date   Cirrhosis (HCC)    Combined arterial insufficiency and corporo-venous occlusive erectile dysfunction 04/28/2016   Depression 10/22/2011   Essential hypertension 08/07/2021   Gastrocnemius muscle strain, left, initial encounter 10/26/2016   Hemochromatosis 10/22/2011   Formatting of this note might be different from the original. Managed by [facility]; diagnosis has been questioned as LFTs have normalized without need for continued blood draws   History of constipation 10/22/2011   Hyperlipidemia    Hypertension    Hypogonadism in male 11/19/2021   Microalbuminuria 10/22/2011   NASH (nonalcoholic steatohepatitis) 10/22/2011   Formatting of this note might be different from the original. Followed by [facility]   Onychocryptosis 05/27/2015   OSA (obstructive sleep apnea) 10/22/2011   Formatting of this note might be different from the original. Apr 25, 2013:  Polysomnography study at [facility] revealed mild obstructive sleep apnea with an overall index of 14 events per our.  Oxygen saturation minimum 77%.  Epworth sleepiness Scale score:  22/24.  BMI:  34. Neck size:  16.5 in.   Swelling of finger of right hand 12/02/2017   Type 2 diabetes mellitus without complication, with long-term current use of insulin (HCC) 10/22/2011    Uncontrolled type 2 diabetes mellitus with hyperglycemia (HCC) 08/07/2021      Past Surgical History:  Procedure Laterality Date   COLONOSCOPY WITH PROPOFOL N/A 10/08/2022   Procedure: COLONOSCOPY WITH PROPOFOL;  Surgeon: Toney Reil, MD;  Location: [facility] ENDOSCOPY;  Service: Gastroenterology;  Laterality: N/A;   HAND SURGERY Right     Allergies  Allergen Reactions   Atorvastatin Other (See Comments)    muscle aches    Current Outpatient Medications  Medication Sig Dispense Refill   escitalopram (LEXAPRO) 20 MG tablet TAKE 1 TABLET(20 MG) BY MOUTH DAILY 90 tablet 1   glucose blood test strip Use as instructed to check blood sugar 100 each 12   insulin glargine (LANTUS) 100 unit/mL SOPN Inject 10 units at bedtime as directed 15 mL 11   Insulin Pen Needle 32G X 5 MM MISC Use daily to direct insulin as directed 100 each 3   lisinopril (ZESTRIL) 5 MG tablet Take 1 tablet (5 mg total) by mouth daily. 90 tablet 3   metFORMIN (GLUCOPHAGE) 1000 MG tablet Take 1 tablet (1,000 mg total) by mouth 2 (two) times daily with a meal. 180 tablet 3   rosuvastatin (CRESTOR) 10 MG tablet Take 1 tablet (10 mg total) by mouth daily. 90 tablet 1   sildenafil (REVATIO) 20 MG tablet Take 3 to 4 tablets by mouth daily as directed 40 tablet 2   No current facility-administered medications for this visit.    Family History Family History  Problem Relation Age of Onset   Liver cancer Mother    Liver disease Mother    Lung cancer Mother    Hyperlipidemia Father  Hypertension Father    Coronary artery disease Father    Coronary artery disease Brother       Social History Social History   Tobacco Use   Smoking status: Never   Smokeless tobacco: Never  Vaping Use   Vaping status: Never Used  Substance Use Topics   Alcohol use: Never   Drug use: Never        Review of Systems  Constitutional:  Positive for malaise/fatigue and weight loss.  HENT:  Positive for nosebleeds.    Eyes: Negative.   Respiratory: Negative.    Cardiovascular: Negative.   Gastrointestinal:  Positive for abdominal pain, diarrhea and nausea. Negative for vomiting.  Genitourinary:  Negative for dysuria, flank pain, frequency, hematuria and urgency.  Skin:  Positive for itching.  Neurological: Negative.   Psychiatric/Behavioral: Negative.       Physical Exam Blood pressure 112/74, pulse 74, temperature 98.1 F (36.7 C), temperature source Oral, height 5\' 10"  (1.778 m), weight 212 lb 9.6 oz (96.4 kg), SpO2 97%. Last Weight  Most recent update: 01/05/2024 10:20 AM    Weight  96.4 kg (212 lb 9.6 oz)             CONSTITUTIONAL: Well developed, and nourished, appropriately responsive and aware without distress.   EYES: Sclera non-icteric.   EARS, NOSE, MOUTH AND THROAT:  The oropharynx is clear. Oral mucosa is pink and moist.   Hearing is intact to voice.  NECK: Trachea is midline, and there is no jugular venous distension.  LYMPH NODES:  Lymph nodes in the neck are not appreciated. RESPIRATORY:  Lungs are clear, and breath sounds are equal bilaterally.  Normal respiratory effort without pathologic use of accessory muscles. CARDIOVASCULAR: Heart is regular in rate and rhythm.   Well perfused.  GI: The abdomen is  soft, nontender, and nondistended. There were no palpable masses.  I did not appreciate hepatosplenomegaly.  MUSCULOSKELETAL:  Symmetrical muscle tone appreciated in all four extremities.    SKIN: Skin turgor is normal. No pathologic skin lesions appreciated.  NEUROLOGIC:  Motor and sensation appear grossly normal.  Cranial nerves are grossly without defect. PSYCH:  Alert and oriented to person, place and time. Affect is appropriate for situation.  Data Reviewed I have personally reviewed what is currently available of the patient's imaging, recent labs and medical records.   Labs:     Latest Ref Rng & Units 12/23/2023    2:52 PM 06/07/2023    9:40 AM 07/23/2022    9:19 AM   CBC  WBC 3.4 - 10.8 x10E3/uL 7.8  7.3  6.4   Hemoglobin 13.0 - 17.7 g/dL 14.7  82.9  56.2   Hematocrit 37.5 - 51.0 % 43.7  41.7  44.0   Platelets 150 - 450 x10E3/uL 249  261.0  212.0       Latest Ref Rng & Units 12/23/2023    2:52 PM 09/19/2023    7:57 AM 06/07/2023    9:40 AM  CMP  Glucose 70 - 99 mg/dL 130  865  784   BUN 6 - 24 mg/dL 9  14  13    Creatinine 0.76 - 1.27 mg/dL 6.96  2.95  2.84   Sodium 134 - 144 mmol/L 140  134  137   Potassium 3.5 - 5.2 mmol/L 4.5  4.0  5.2 No hemolysis seen   Chloride 96 - 106 mmol/L 100  100  100   CO2 20 - 29 mmol/L 24  26  29  Calcium 8.7 - 10.2 mg/dL 81.1  91.4  78.2   Total Protein 6.0 - 8.5 g/dL 7.9   8.5   Total Bilirubin 0.0 - 1.2 mg/dL 0.7   0.9   Alkaline Phos 44 - 121 IU/L 91   63   AST 0 - 40 IU/L 87   75   ALT 0 - 44 IU/L 131   108     Imaging: Radiological images reviewed:   Within last 24 hrs: No results found.  CLINICAL DATA:  Left upper quadrant pain. Hemoptysis. Elevated LFTs, amylase and lipase.   EXAM: CT ABDOMEN AND PELVIS WITH CONTRAST   TECHNIQUE: Multidetector CT imaging of the abdomen and pelvis was performed using the standard protocol following bolus administration of intravenous contrast.   RADIATION DOSE REDUCTION: This exam was performed according to the departmental dose-optimization program which includes automated exposure control, adjustment of the mA and/or kV according to patient size and/or use of iterative reconstruction technique.   CONTRAST:  80mL ISOVUE-370 IOPAMIDOL (ISOVUE-370) INJECTION 76%   COMPARISON:  None Available.   FINDINGS: Lower chest: Heart is normal size.  Lung bases are clear.   Hepatobiliary: Liver and biliary tree are normal. Gallbladder demonstrates a 1.2 cm focal density in the region of the gallbladder neck which may represent sludge versus noncalcified stones and less likely mass.   Pancreas: Pancreas is within normal.   Spleen: Normal.   Adrenals/Urinary  Tract: Left adrenal gland is normal. Oval indeterminate 1.9 cm mass over the right adrenal gland. This has Hounsfield unit measurements of 53. Kidneys are normal in size without hydronephrosis or nephrolithiasis. 2.5 cm cyst over the mid to upper pole right kidney. No further follow-up imaging is recommended. Ureters and bladder are normal.   Stomach/Bowel: Stomach and small bowel are normal. Appendix is normal. Colon is normal.   Vascular/Lymphatic: Abdominal aorta is normal in caliber.   There is mild adenopathy in the region of the porta hepatis and portacaval region. There is a portacaval lymph node measuring 1.7 cm by short axis and celiac axis lymph node measuring 1.4 cm by short axis. A few shotty para aortic lymph nodes are present.   Reproductive: Normal.   Other: No free fluid or focal inflammatory change.   Musculoskeletal: No focal abnormality.   IMPRESSION: 1. No acute findings in the abdomen/pelvis. 2. 1.2 cm focal density in the region of the gallbladder neck which may represent sludge versus noncalcified stones and less likely mass. Recommend right upper quadrant ultrasound for further evaluation. If ultrasound inconclusive, abdominal MRI would be helpful for further evaluation. 3. Mild adenopathy in the region of the porta hepatis and portacaval region. This is nonspecific as recommend attention on follow-up. 4. 1.9 cm indeterminate mass over the right adrenal gland. This may represent a benign adenoma, but is nonspecific. Recommend follow-up adrenal washout CT in 1 year. If stable for = 1 year, no further follow-up imaging. JACR 2017 Aug; 14(8):1038-44, JCAT 2016 Mar-Apr; 40(2):194-200, Urol J 2006 Spring; 3(2):71-4. 5. 2.5 cm right renal cyst.  No follow-up imaging is recommended.     Electronically Signed   By: Elberta Fortis M.D.   On: 12/28/2023 16:47  Assessment    Chronic calculus cholecystitis  Patient Active Problem List   Diagnosis Date  Noted   Screening for colon cancer 10/08/2022   Gastric erythema 10/08/2022   Cecal polyp 10/08/2022   Adenomatous polyp of transverse colon 10/08/2022   Rectal polyp 10/08/2022   Hypogonadism in male 11/19/2021  Acanthosis nigricans 08/07/2021   Essential hypertension 08/07/2021   Uncontrolled type 2 diabetes mellitus with hyperglycemia (HCC) 08/07/2021   Swelling of finger of right hand 12/02/2017   Gastrocnemius muscle strain, left, initial encounter 10/26/2016   Combined arterial insufficiency and corporo-venous occlusive erectile dysfunction 04/28/2016   Onychocryptosis 05/27/2015   Depression 10/22/2011   Hemochromatosis 10/22/2011   History of constipation 10/22/2011   Hyperlipidemia 10/22/2011   Microalbuminuria 10/22/2011   NASH (nonalcoholic steatohepatitis) 10/22/2011   OSA (obstructive sleep apnea) 10/22/2011   Type 2 diabetes mellitus without complication, with long-term current use of insulin (HCC) 10/22/2011    Plan    This was discussed thoroughly.  Optimal plan is for robotic cholecystectomy utilizing ICG imaging. Risks and benefits have been discussed with the patient which include but are not limited to anesthesia, bleeding, infection, biliary ductal injury, resulting in leak or stenosis, other associated unanticipated injuries affiliated with laparoscopic surgery.   Reviewed that removing the gallbladder will only address the symptoms related to the gallbladder itself.  I believe there is the desire to proceed, accepting the risks with understanding.  Questions elicited and answered to satisfaction.    No guarantees ever expressed or implied.    Face-to-face time spent with the patient and accompanying care providers(if present) was 30 minutes, spent counseling, educating, and coordinating care of the patient.    These notes generated with voice recognition software. I apologize for typographical errors.  Campbell Lerner M.D., FACS 01/05/2024, 10:57 AM

## 2024-01-10 ENCOUNTER — Encounter
Admission: RE | Admit: 2024-01-10 | Discharge: 2024-01-10 | Disposition: A | Discharge status: Home / Self Care | Payer: Managed Care, Other (non HMO) | Source: Ambulatory Visit | Attending: Surgery | Admitting: Surgery

## 2024-01-10 ENCOUNTER — Other Ambulatory Visit: Payer: Self-pay

## 2024-01-10 VITALS — Ht 70.0 in | Wt 211.0 lb

## 2024-01-10 DIAGNOSIS — I10 Essential (primary) hypertension: Secondary | ICD-10-CM | POA: Insufficient documentation

## 2024-01-10 DIAGNOSIS — Z01810 Encounter for preprocedural cardiovascular examination: Secondary | ICD-10-CM | POA: Insufficient documentation

## 2024-01-10 DIAGNOSIS — E119 Type 2 diabetes mellitus without complications: Secondary | ICD-10-CM

## 2024-01-10 HISTORY — DX: Calculus of gallbladder with chronic cholecystitis without obstruction: K80.10

## 2024-01-10 NOTE — Patient Instructions (Addendum)
 Your procedure is scheduled on: Wednesday 01/11/24 Report to the Registration Desk on the 1st floor of the [facility]. To find out your arrival time, please call [phone] between 1PM - 3PM on: Tuesday 01/10/24 If your arrival time is 6:00 am, do not arrive before that time as the [facility] entrance doors do not open until 6:00 am.  REMEMBER: Instructions that are not followed completely may result in serious medical risk, up to and including death; or upon the discretion of your surgeon and anesthesiologist your surgery may need to be rescheduled.  Do not eat food or drink fluids after midnight the night before surgery.  No gum chewing or hard candies.   One week prior to surgery: Stop Anti-inflammatories (NSAIDS) such as Advil , Aleve, Ibuprofen , Motrin , Naproxen, Naprosyn and Aspirin based products such as Excedrin, Goody's Powder, BC Powder. Stop ANY OVER THE COUNTER supplements until after surgery.  You may however, continue to take Tylenol  if needed for pain up until the day of surgery.  Stop metFORMIN  (GLUCOPHAGE ) 1000 MG  2 days before your surgery. Stop Semaglutide , 1 MG/DOSE, (OZEMPIC , 1 MG/DOSE,) 4 MG/3ML SOPN 7 days prior to your surgery. Do not take until after your surgery.. Take half of your normal dose of insulin  glargine (LANTUS ) 100 unit/mL SOPN the night before your surgery. NO insulin  the morning of surgery.   Continue taking all of your other prescription medications up until the day of surgery.  ON THE DAY OF SURGERY ONLY TAKE THESE MEDICATIONS WITH SIPS OF WATER:  None     No Alcohol for 24 hours before or after surgery.  No Smoking including e-cigarettes for 24 hours before surgery.  No chewable tobacco products for at least 6 hours before surgery.  No nicotine patches on the day of surgery.  Do not use any recreational drugs for at least a week (preferably 2 weeks) before your surgery.  Please be advised that the combination of cocaine and  anesthesia may have negative outcomes, up to and including death. If you test positive for cocaine, your surgery will be cancelled.  On the morning of surgery brush your teeth with toothpaste and water, you may rinse your mouth with mouthwash if you wish. Do not swallow any toothpaste or mouthwash.  Use CHG Soap or wipes as directed on instruction sheet.  Do not wear jewelry, make-up, hairpins, clips or nail polish.  For welded (permanent) jewelry: bracelets, anklets, waist bands, etc.  Please have this removed prior to surgery.  If it is not removed, there is a chance that hospital personnel will need to cut it off on the day of surgery.  Do not wear lotions, powders, or perfumes.   Do not shave body hair from the neck down 48 hours before surgery.  Contact lenses, hearing aids and dentures may not be worn into surgery.  Do not bring valuables to the hospital. [facility] is not responsible for any missing/lost belongings or valuables.   Notify your doctor if there is any change in your medical condition (cold, fever, infection).  Wear comfortable clothing (specific to your surgery type) to the hospital.  After surgery, you can help prevent lung complications by doing breathing exercises.  Take deep breaths and cough every 1-2 hours. Your doctor may order a device called an Incentive Spirometer to help you take deep breaths. When coughing or sneezing, hold a pillow firmly against your incision with both hands. This is called "splinting." Doing this helps protect your incision. It  also decreases belly discomfort.  If you are being admitted to the hospital overnight, leave your suitcase in the car. After surgery it may be brought to your room.  In case of increased patient census, it may be necessary for you, the patient, to continue your postoperative care in the Same Day Surgery department.  If you are being discharged the day of surgery, you will not be allowed to drive home. You  will need a responsible individual to drive you home and stay with you for 24 hours after surgery.   If you are taking public transportation, you will need to have a responsible individual with you.  Please call the Pre-admissions Testing Dept. at [phone] if you have any questions about these instructions.  Surgery Visitation Policy:  Patients having surgery or a procedure may have two visitors.  Children under the age of 18 must have an adult with them who is not the patient.  Temporary Visitor Restrictions Due to increasing cases of flu, RSV and COVID-19: Children ages 13 and under will not be able to visit patients in [facility] hospitals under most circumstances.  Inpatient Visitation:    Visiting hours are 7 a.m. to 8 p.m. Up to four visitors are allowed at one time in a patient room. The visitors may rotate out with other people during the day.  One visitor age 38 or older may stay with the patient overnight and must be in the room by 8 p.m.     Preparing for Surgery with CHLORHEXIDINE  GLUCONATE (CHG) Soap  Chlorhexidine  Gluconate (CHG) Soap  o An antiseptic cleaner that kills germs and bonds with the skin to continue killing germs even after washing  o Used for showering the night before surgery and morning of surgery  Before surgery, you can play an important role by reducing the number of germs on your skin.  CHG (Chlorhexidine  gluconate) soap is an antiseptic cleanser which kills germs and bonds with the skin to continue killing germs even after washing.  Please do not use if you have an allergy to CHG or antibacterial soaps. If your skin becomes reddened/irritated stop using the CHG.  1. Shower the NIGHT BEFORE SURGERY and the MORNING OF SURGERY with CHG soap.  2. If you choose to wash your hair, wash your hair first as usual with your normal shampoo.  3. After shampooing, rinse your hair and body thoroughly to remove the shampoo.  4. Use CHG as you would  any other liquid soap. You can apply CHG directly to the skin and wash gently with a scrungie or a clean washcloth.  5. Apply the CHG soap to your body only from the neck down. Do not use on open wounds or open sores. Avoid contact with your eyes, ears, mouth, and genitals (private parts). Wash face and genitals (private parts) with your normal soap.  6. Wash thoroughly, paying special attention to the area where your surgery will be performed.  7. Thoroughly rinse your body with warm water.  8. Do not shower/wash with your normal soap after using and rinsing off the CHG soap.  9. Pat yourself dry with a clean towel.  10. Wear clean pajamas to bed the night before surgery.  12. Place clean sheets on your bed the night of your first shower and do not sleep with pets.  13. Shower again with the CHG soap on the day of surgery prior to arriving at the hospital.  14. Do not apply any deodorants/lotions/powders.  15. Please wear clean clothes to the hospital.

## 2024-01-11 ENCOUNTER — Ambulatory Visit: Payer: Managed Care, Other (non HMO) | Admitting: General Practice

## 2024-01-11 ENCOUNTER — Ambulatory Visit: Payer: Managed Care, Other (non HMO) | Admitting: Urgent Care

## 2024-01-11 ENCOUNTER — Encounter: Payer: Self-pay | Admitting: Surgery

## 2024-01-11 ENCOUNTER — Other Ambulatory Visit: Payer: Self-pay

## 2024-01-11 ENCOUNTER — Ambulatory Visit
Admission: RE | Admit: 2024-01-11 | Discharge: 2024-01-11 | Disposition: A | Discharge status: Home / Self Care | Payer: Managed Care, Other (non HMO) | Source: Ambulatory Visit | Attending: Surgery | Admitting: Surgery

## 2024-01-11 ENCOUNTER — Encounter
Admission: RE | Disposition: A | Discharge status: Home / Self Care | Payer: Self-pay | Source: Ambulatory Visit | Attending: Surgery

## 2024-01-11 DIAGNOSIS — K8010 Calculus of gallbladder with chronic cholecystitis without obstruction: Secondary | ICD-10-CM | POA: Diagnosis present

## 2024-01-11 DIAGNOSIS — Z7985 Long-term (current) use of injectable non-insulin antidiabetic drugs: Secondary | ICD-10-CM | POA: Diagnosis not present

## 2024-01-11 DIAGNOSIS — K7460 Unspecified cirrhosis of liver: Secondary | ICD-10-CM | POA: Diagnosis not present

## 2024-01-11 DIAGNOSIS — K811 Chronic cholecystitis: Secondary | ICD-10-CM | POA: Diagnosis not present

## 2024-01-11 DIAGNOSIS — G4733 Obstructive sleep apnea (adult) (pediatric): Secondary | ICD-10-CM | POA: Insufficient documentation

## 2024-01-11 DIAGNOSIS — E119 Type 2 diabetes mellitus without complications: Secondary | ICD-10-CM | POA: Diagnosis not present

## 2024-01-11 DIAGNOSIS — Z794 Long term (current) use of insulin: Secondary | ICD-10-CM | POA: Diagnosis not present

## 2024-01-11 DIAGNOSIS — Z7984 Long term (current) use of oral hypoglycemic drugs: Secondary | ICD-10-CM | POA: Insufficient documentation

## 2024-01-11 DIAGNOSIS — K7581 Nonalcoholic steatohepatitis (NASH): Secondary | ICD-10-CM | POA: Diagnosis not present

## 2024-01-11 DIAGNOSIS — I10 Essential (primary) hypertension: Secondary | ICD-10-CM | POA: Insufficient documentation

## 2024-01-11 LAB — GLUCOSE, CAPILLARY
Glucose-Capillary: 87 mg/dL (ref 70–99)
Glucose-Capillary: 164 mg/dL — ABNORMAL HIGH (ref 70–99)

## 2024-01-11 SURGERY — CHOLECYSTECTOMY, ROBOT-ASSISTED, LAPAROSCOPIC
Anesthesia: General

## 2024-01-11 MED ORDER — BUPIVACAINE LIPOSOME 1.3 % IJ SUSP: 20.0000 mL | Freq: Once | INTRAMUSCULAR | Status: DC

## 2024-01-11 MED ORDER — FENTANYL CITRATE (PF) 100 MCG/2ML IJ SOLN
INTRAMUSCULAR | Status: AC
  Filled 2024-01-11: qty 2

## 2024-01-11 MED ORDER — GABAPENTIN 300 MG PO CAPS
ORAL_CAPSULE | ORAL | Status: AC
  Filled 2024-01-11: qty 1

## 2024-01-11 MED ORDER — IBUPROFEN 800 MG PO TABS: 800.0000 mg | ORAL_TABLET | Freq: Three times a day (TID) | ORAL | 0 refills | Status: DC | PRN

## 2024-01-11 MED ORDER — MIDAZOLAM HCL 2 MG/2ML IJ SOLN
INTRAMUSCULAR | Status: AC
  Filled 2024-01-11: qty 2

## 2024-01-11 MED ORDER — PROPOFOL 10 MG/ML IV BOLUS
INTRAVENOUS | Status: AC
  Filled 2024-01-11: qty 20

## 2024-01-11 MED ORDER — OXYCODONE HCL 5 MG PO TABS
5.0000 mg | ORAL_TABLET | Freq: Once | ORAL | Status: AC | PRN
  Administered 2024-01-11: 5 mg via ORAL

## 2024-01-11 MED ORDER — SODIUM CHLORIDE 0.9 % IR SOLN
Status: DC | PRN
  Administered 2024-01-11: 3000 mL

## 2024-01-11 MED ORDER — MIDAZOLAM HCL 2 MG/2ML IJ SOLN
INTRAMUSCULAR | Status: DC | PRN
  Administered 2024-01-11: 2 mg via INTRAVENOUS

## 2024-01-11 MED ORDER — FENTANYL CITRATE (PF) 100 MCG/2ML IJ SOLN
25.0000 ug | INTRAMUSCULAR | Status: DC | PRN
Start: 2024-01-11 — End: 2024-01-11

## 2024-01-11 MED ORDER — HYDROCODONE-ACETAMINOPHEN 5-325 MG PO TABS: 1.0000 | ORAL_TABLET | Freq: Four times a day (QID) | ORAL | 0 refills | Status: DC | PRN

## 2024-01-11 MED ORDER — FENTANYL CITRATE (PF) 100 MCG/2ML IJ SOLN
INTRAMUSCULAR | Status: DC | PRN
  Administered 2024-01-11: 50 ug via INTRAVENOUS
  Administered 2024-01-11 (×2): 25 ug via INTRAVENOUS
  Administered 2024-01-11: 50 ug via INTRAVENOUS
  Administered 2024-01-11 (×2): 25 ug via INTRAVENOUS

## 2024-01-11 MED ORDER — ACETAMINOPHEN 500 MG PO TABS
ORAL_TABLET | ORAL | Status: AC
  Filled 2024-01-11: qty 2

## 2024-01-11 MED ORDER — 0.9 % SODIUM CHLORIDE (POUR BTL) OPTIME
TOPICAL | Status: DC | PRN
  Administered 2024-01-11: 500 mL

## 2024-01-11 MED ORDER — PHENYLEPHRINE 80 MCG/ML (10ML) SYRINGE FOR IV PUSH (FOR BLOOD PRESSURE SUPPORT)
PREFILLED_SYRINGE | INTRAVENOUS | Status: DC | PRN
  Administered 2024-01-11: 80 ug via INTRAVENOUS

## 2024-01-11 MED ORDER — INDOCYANINE GREEN 25 MG IV SOLR
INTRAVENOUS | Status: AC
  Filled 2024-01-11: qty 10

## 2024-01-11 MED ORDER — PROPOFOL 10 MG/ML IV BOLUS
INTRAVENOUS | Status: DC | PRN
  Administered 2024-01-11: 200 mg via INTRAVENOUS

## 2024-01-11 MED ORDER — BUPIVACAINE-EPINEPHRINE (PF) 0.25% -1:200000 IJ SOLN
INTRAMUSCULAR | Status: DC | PRN
  Administered 2024-01-11: 30 mL

## 2024-01-11 MED ORDER — CELECOXIB 200 MG PO CAPS
ORAL_CAPSULE | ORAL | Status: AC
  Filled 2024-01-11: qty 1

## 2024-01-11 MED ORDER — DEXAMETHASONE SODIUM PHOSPHATE 10 MG/ML IJ SOLN
INTRAMUSCULAR | Status: DC | PRN
  Administered 2024-01-11: 4 mg via INTRAVENOUS

## 2024-01-11 MED ORDER — ROCURONIUM BROMIDE 10 MG/ML (PF) SYRINGE
PREFILLED_SYRINGE | INTRAVENOUS | Status: DC | PRN
  Administered 2024-01-11: 50 mg via INTRAVENOUS

## 2024-01-11 MED ORDER — BUPIVACAINE LIPOSOME 1.3 % IJ SUSP
INTRAMUSCULAR | Status: DC | PRN
  Administered 2024-01-11: 20 mL

## 2024-01-11 MED ORDER — OXYCODONE HCL 5 MG/5ML PO SOLN: 5.0000 mg | Freq: Once | ORAL | Status: AC | PRN

## 2024-01-11 MED ORDER — CHLORHEXIDINE GLUCONATE 0.12 % MT SOLN
15.0000 mL | Freq: Once | OROMUCOSAL | Status: AC
  Administered 2024-01-11: 15 mL via OROMUCOSAL

## 2024-01-11 MED ORDER — BUPIVACAINE LIPOSOME 1.3 % IJ SUSP
INTRAMUSCULAR | Status: AC
  Filled 2024-01-11: qty 20

## 2024-01-11 MED ORDER — OXYCODONE HCL 5 MG PO TABS
ORAL_TABLET | ORAL | Status: AC
  Filled 2024-01-11: qty 1

## 2024-01-11 MED ORDER — INDOCYANINE GREEN 25 MG IV SOLR
1.2500 mg | Freq: Once | INTRAVENOUS | Status: AC
  Administered 2024-01-11: 1.25 mg via INTRAVENOUS

## 2024-01-11 MED ORDER — CHLORHEXIDINE GLUCONATE CLOTH 2 % EX PADS
6.0000 | MEDICATED_PAD | Freq: Once | CUTANEOUS | Status: AC
  Administered 2024-01-11: 6 via TOPICAL

## 2024-01-11 MED ORDER — ACETAMINOPHEN 500 MG PO TABS
1000.0000 mg | ORAL_TABLET | ORAL | Status: AC
  Administered 2024-01-11: 1000 mg via ORAL

## 2024-01-11 MED ORDER — BUPIVACAINE-EPINEPHRINE (PF) 0.25% -1:200000 IJ SOLN
INTRAMUSCULAR | Status: AC
  Filled 2024-01-11: qty 30

## 2024-01-11 MED ORDER — CHLORHEXIDINE GLUCONATE CLOTH 2 % EX PADS: 6.0000 | MEDICATED_PAD | Freq: Once | CUTANEOUS | Status: DC

## 2024-01-11 MED ORDER — SUGAMMADEX SODIUM 200 MG/2ML IV SOLN
INTRAVENOUS | Status: DC | PRN
  Administered 2024-01-11: 200 mg via INTRAVENOUS

## 2024-01-11 MED ORDER — CEFAZOLIN SODIUM-DEXTROSE 2-4 GM/100ML-% IV SOLN
INTRAVENOUS | Status: AC
  Filled 2024-01-11: qty 100

## 2024-01-11 MED ORDER — ONDANSETRON HCL 4 MG/2ML IJ SOLN
INTRAMUSCULAR | Status: DC | PRN
  Administered 2024-01-11: 4 mg via INTRAVENOUS

## 2024-01-11 MED ORDER — LIDOCAINE HCL (CARDIAC) PF 100 MG/5ML IV SOSY
PREFILLED_SYRINGE | INTRAVENOUS | Status: DC | PRN
  Administered 2024-01-11: 60 mg via INTRAVENOUS

## 2024-01-11 MED ORDER — ORAL CARE MOUTH RINSE: 15.0000 mL | Freq: Once | OROMUCOSAL | Status: AC

## 2024-01-11 MED ORDER — CELECOXIB 200 MG PO CAPS
200.0000 mg | ORAL_CAPSULE | ORAL | Status: AC
  Administered 2024-01-11: 200 mg via ORAL

## 2024-01-11 MED ORDER — GABAPENTIN 300 MG PO CAPS
300.0000 mg | ORAL_CAPSULE | ORAL | Status: AC
  Administered 2024-01-11: 300 mg via ORAL

## 2024-01-11 MED ORDER — SODIUM CHLORIDE 0.9 % IV SOLN
INTRAVENOUS | Status: DC
Start: 2024-01-11 — End: 2024-01-11

## 2024-01-11 MED ORDER — EPHEDRINE SULFATE-NACL 50-0.9 MG/10ML-% IV SOSY
PREFILLED_SYRINGE | INTRAVENOUS | Status: DC | PRN
  Administered 2024-01-11 (×2): 10 mg via INTRAVENOUS

## 2024-01-11 MED ORDER — CEFAZOLIN SODIUM-DEXTROSE 2-4 GM/100ML-% IV SOLN
2.0000 g | INTRAVENOUS | Status: AC
  Administered 2024-01-11: 2 g via INTRAVENOUS

## 2024-01-11 SURGICAL SUPPLY — 43 items
BAG PRESSURE INF REUSE 3000 (BAG) IMPLANT
CLIP LIGATING HEM O LOK PURPLE (MISCELLANEOUS) ×1 IMPLANT
COVER TIP SHEARS 8 DVNC (MISCELLANEOUS) ×1 IMPLANT
DERMABOND ADVANCED .7 DNX12 (GAUZE/BANDAGES/DRESSINGS) ×1 IMPLANT
DRAPE ARM DVNC X/XI (DISPOSABLE) ×4 IMPLANT
DRAPE COLUMN DVNC XI (DISPOSABLE) ×1 IMPLANT
FORCEPS BPLR R/ABLATION 8 DVNC (INSTRUMENTS) ×1 IMPLANT
FORCEPS PROGRASP DVNC XI (FORCEP) ×1 IMPLANT
GLOVE ORTHO TXT STRL SZ7.5 (GLOVE) ×2 IMPLANT
GOWN STRL REUS W/ TWL LRG LVL3 (GOWN DISPOSABLE) ×2 IMPLANT
GOWN STRL REUS W/ TWL XL LVL3 (GOWN DISPOSABLE) ×2 IMPLANT
GRASPER SUT TROCAR 14GX15 (MISCELLANEOUS) ×1 IMPLANT
IRRIGATION STRYKERFLOW (MISCELLANEOUS) IMPLANT
IRRIGATOR STRYKERFLOW (MISCELLANEOUS) ×1
IRRIGATOR SUCT 8 DISP DVNC XI (IRRIGATION / IRRIGATOR) IMPLANT
IV NS IRRIG 3000ML ARTHROMATIC (IV SOLUTION) IMPLANT
KIT PINK PAD W/HEAD ARE REST (MISCELLANEOUS) ×1
KIT PINK PAD W/HEAD ARM REST (MISCELLANEOUS) ×1 IMPLANT
KIT TURNOVER KIT A (KITS) ×1 IMPLANT
LABEL OR SOLS (LABEL) ×1 IMPLANT
MANIFOLD NEPTUNE II (INSTRUMENTS) ×1 IMPLANT
NDL HYPO 22X1.5 SAFETY MO (MISCELLANEOUS) ×1 IMPLANT
NDL INSUFFLATION 14GA 120MM (NEEDLE) ×1 IMPLANT
NEEDLE HYPO 22X1.5 SAFETY MO (MISCELLANEOUS) ×1
NEEDLE INSUFFLATION 14GA 120MM (NEEDLE) ×1
NS IRRIG 500ML POUR BTL (IV SOLUTION) ×1 IMPLANT
PACK LAP CHOLECYSTECTOMY (MISCELLANEOUS) ×1 IMPLANT
SCISSORS MNPLR CVD DVNC XI (INSTRUMENTS) ×1 IMPLANT
SEAL UNIV 5-12 XI (MISCELLANEOUS) ×4 IMPLANT
SET TUBE SMOKE EVAC HIGH FLOW (TUBING) ×1 IMPLANT
SOL ELECTROSURG ANTI STICK (MISCELLANEOUS) ×1
SOLUTION ELECTROSURG ANTI STCK (MISCELLANEOUS) ×1 IMPLANT
SPIKE FLUID TRANSFER (MISCELLANEOUS) ×1 IMPLANT
SUT MNCRL 4-0 27 PS-2 XMFL (SUTURE) ×1
SUT VICRYL 0 UR6 27IN ABS (SUTURE) ×1 IMPLANT
SUTURE MNCRL 4-0 27XMF (SUTURE) ×1 IMPLANT
SYR TOOMEY IRRIG 70ML (MISCELLANEOUS)
SYRINGE TOOMEY IRRIG 70ML (MISCELLANEOUS) IMPLANT
SYS BAG RETRIEVAL 10MM (BASKET) ×1
SYSTEM BAG RETRIEVAL 10MM (BASKET) ×1 IMPLANT
TRAP FLUID SMOKE EVACUATOR (MISCELLANEOUS) ×1 IMPLANT
TROCAR Z-THREAD FIOS 11X100 BL (TROCAR) ×1 IMPLANT
WATER STERILE IRR 500ML POUR (IV SOLUTION) ×1 IMPLANT

## 2024-01-11 NOTE — Op Note (Signed)
 Robotic cholecystectomy with Indocyamine Green Ductal Imaging.   Pre-operative Diagnosis: Chronic calculus cholecystitis  Post-operative Diagnosis:  Same.  Procedure: Robotic assisted laparoscopic cholecystectomy with Indocyamine Green Ductal Imaging.   Surgeon: Honor Leghorn, M.D., FACS  Anesthesia: General. with endotracheal tube  Findings: enlarged CD lymph node.   Estimated Blood Loss: 15 mL         Drains: None         Specimens: Gallbladder           Complications: none  Procedure Details  The patient was seen again in the Holding Room.  1.25 mg dose of ICG was administered intravenously.   The benefits, complications, treatment options, risks and expected outcomes were again reviewed with the patient. The likelihood of improving the patient's symptoms with return to their baseline status is good.  The patient and/or family concurred with the proposed plan, giving informed consent, again alternatives reviewed.  The patient was taken to Operating Room, identified, and the procedure verified as robotic assisted laparoscopic cholecystectomy.  Prior to the induction of general anesthesia, antibiotic prophylaxis was administered. VTE prophylaxis was in place. General endotracheal anesthesia was then administered and tolerated well. The patient was positioned in the supine position.  After the induction, the abdomen was prepped with Chloraprep and draped in the sterile fashion.  A Time Out was held and the above information confirmed.  Local infiltration with 0.25% Marcaine  with epinephrine  is utilized for all skin incisions.  Made a 12 mm incision on the right periumbilical site, I advanced an optical 11mm port under direct visualization into the peritoneal cavity.  Once the peritoneum was penetrated, insufflation was initiated.  The trocar was then advanced into the abdominal cavity under direct visualization. Pneumoperitoneum was then continued utilizing CO2 at 15 mmHg or less and  tolerated well without any adverse changes in the patient's vital signs.  Two 8.5-mm ports were placed in the left lower quadrant and laterally, and one to the right lower quadrant, all under direct vision. Local infiltration with a mixture of Exparel  and 0.25% Marcaine  with epinephrine  is utilized for all port sites with deep infiltration under visualization.   The patient was positioned  in reverse Trendelenburg, tilted the patient's left side down.  Da Vinci XI robot was then positioned on to the patient's left side, and docked.  The gallbladder was identified, the fundus grasped via the arm 4 Prograsp and retracted cephalad. Adhesions were lysed with scissors and cautery.  The infundibulum was identified grasped and retracted laterally, exposing the peritoneum overlying the triangle of Calot. This was then opened and dissected using cautery & scissors.  We did achieve the critical view of safety with 2 and only 2 structures entering the gallbladder and we were able to see the liver plate posterior to this from both sides;with the extended critical view of the cystic duct and cystic artery obtained, aided by the ICG via FireFly which improved localization of the major ductal anatomy.   A larger than usual cystic duct lymph node was dissected out and removed.   The cystic duct was clearly identified and dissected to isolation.   Artery well isolated and clipped, and the cystic duct was triple clipped and divided with scissors, as close to the gallbladder neck as feasible, thus leaving two on the remaining stump.  The specimen side of the artery is sealed with bipolar and divided with monopolar scissors.   The gallbladder was taken from the gallbladder fossa in a retrograde fashion with  the electrocautery. The gallbladder was removed and placed in an Endocatch bag.  The liver bed is inspected. Hemostasis was confirmed.  The robot was undocked and moved away from the operative field. 2+ liters of NSS  irrigation was utilized and was aspirated clear.  The gallbladder and Endocatch sac were then removed through the paraumbilical port site.   Inspection of the right upper quadrant was performed. No bleeding, bile duct injury or leak, or bowel injury was noted. The infra-umbilical port site fascia was closed with interrumpted 0 Vicryl sutures using PMI/cone under direct visualization. Pneumoperitoneum was released and ports removed.  4-0 subcuticular Monocryl was used to close the skin. Dermabond was  applied.  The patient was then extubated and brought to the recovery room in stable condition. Sponge, lap, and needle counts were correct at closure and at the conclusion of the case.               Honor Leghorn, M.D., [employer] 01/11/2024 4:14 PM

## 2024-01-11 NOTE — Transfer of Care (Signed)
 Immediate Anesthesia Transfer of Care Note  Patient: Nathaniel Cox  Procedure(s) Performed: XI ROBOTIC ASSISTED LAPAROSCOPIC CHOLECYSTECTOMY INDOCYANINE GREEN  FLUORESCENCE IMAGING (ICG)  Patient Location: PACU  Anesthesia Type:General  Level of Consciousness: drowsy  Airway & Oxygen Therapy: Patient connected to face mask oxygen  Post-op Assessment: Report given to RN  Post vital signs: Reviewed and stable  Last Vitals:  Vitals Value Taken Time  BP 147/90 01/11/24 1617  Temp 36.6 C 01/11/24 1617  Pulse 73 01/11/24 1624  Resp 18 01/11/24 1624  SpO2 98 % 01/11/24 1624  Vitals shown include unfiled device data.  Last Pain:  Vitals:   01/11/24 1617  TempSrc:   PainSc: 0-No pain         Complications: No notable events documented.

## 2024-01-11 NOTE — Interval H&P Note (Signed)
 History and Physical Interval Note:  01/11/2024 2:13 PM  Nathaniel Cox  has presented today for surgery, with the diagnosis of chronic calculous cholecystitis.  The various methods of treatment have been discussed with the patient and family. After consideration of risks, benefits and other options for treatment, the patient has consented to  Procedure(s): XI ROBOTIC ASSISTED LAPAROSCOPIC CHOLECYSTECTOMY (N/A) INDOCYANINE GREEN  FLUORESCENCE IMAGING (ICG) (N/A) as a surgical intervention.  The patient's history has been reviewed, patient examined, no change in status, stable for surgery.  I have reviewed the patient's chart and labs.  Questions were answered to the patient's satisfaction.     Honor Leghorn

## 2024-01-11 NOTE — Anesthesia Preprocedure Evaluation (Addendum)
 Anesthesia Evaluation  Patient identified by MRN, date of birth, ID band Patient awake    Reviewed: Allergy & Precautions, NPO status , Patient's Chart, lab work & pertinent test results  Airway Mallampati: III  TM Distance: >3 FB Neck ROM: full    Dental  (+) Chipped   Pulmonary sleep apnea and Continuous Positive Airway Pressure Ventilation    Pulmonary exam normal        Cardiovascular hypertension, Normal cardiovascular exam     Neuro/Psych negative neurological ROS  negative psych ROS   GI/Hepatic negative GI ROS, Neg liver ROS,,,  Endo/Other  diabetes    Renal/GU      Musculoskeletal   Abdominal   Peds  Hematology negative hematology ROS (+)   Anesthesia Other Findings Past Medical History: No date: Chronic cholecystitis with calculus No date: Cirrhosis (HCC) 04/28/2016: Combined arterial insufficiency and corporo-venous  occlusive erectile dysfunction 10/22/2011: Depression 08/07/2021: Essential hypertension 10/26/2016: Gastrocnemius muscle strain, left, initial encounter 10/22/2011: Hemochromatosis     Comment:  Formatting of this note might be different from the               original. Managed by [facility]; diagnosis has been               questioned as LFTs have normalized without need for               continued blood draws 10/22/2011: History of constipation No date: Hyperlipidemia No date: Hypertension 11/19/2021: Hypogonadism in male 10/22/2011: Microalbuminuria 10/22/2011: NASH (nonalcoholic steatohepatitis)     Comment:  Formatting of this note might be different from the               original. Followed by [facility] 05/27/2015: Onychocryptosis 10/22/2011: OSA (obstructive sleep apnea)     Comment:  Formatting of this note might be different from the               original. Apr 25, 2013:  Polysomnography study at [facility] revealed mild obstructive sleep apnea                with an overall index of 14 events per our.  Oxygen               saturation minimum 77%.  Epworth sleepiness Scale score:               22/24.  BMI:  34. Neck size:  16.5 in. 12/02/2017: Swelling of finger of right hand 10/22/2011: Type 2 diabetes mellitus without complication, with long- term current use of insulin  (HCC) 08/07/2021: Uncontrolled type 2 diabetes mellitus with hyperglycemia  [facility])  Past Surgical History: 10/08/2022: COLONOSCOPY WITH PROPOFOL ; N/A     Comment:  Procedure: COLONOSCOPY WITH PROPOFOL ;  Surgeon: Unk Corinn Skiff, MD;  Location: [facility] ENDOSCOPY;  Service:               Gastroenterology;  Laterality: N/A; No date: HAND SURGERY; Right  BMI    Body Mass Index: 30.13 kg/m      Reproductive/Obstetrics negative OB ROS                             Anesthesia Physical Anesthesia Plan  ASA: 2  Anesthesia Plan: General ETT   Post-op Pain  Management:    Induction: Intravenous  PONV Risk Score and Plan: 2 and Ondansetron , Dexamethasone  and Midazolam   Airway Management Planned: Oral ETT  Additional Equipment:   Intra-op Plan:   Post-operative Plan: Extubation in OR  Informed Consent: I have reviewed the patients History and Physical, chart, labs and discussed the procedure including the risks, benefits and alternatives for the proposed anesthesia with the patient or authorized representative who has indicated his/her understanding and acceptance.     Dental Advisory Given  Plan Discussed with: Anesthesiologist, CRNA and Surgeon  Anesthesia Plan Comments: (Patient consented for risks of anesthesia including but not limited to:  - adverse reactions to medications - damage to eyes, teeth, lips or other oral mucosa - nerve damage due to positioning  - sore throat or hoarseness - Damage to heart, brain, nerves, lungs, other parts of body or loss of life  Patient voiced understanding and assent.)        Anesthesia Quick Evaluation

## 2024-01-11 NOTE — Anesthesia Procedure Notes (Addendum)
 Procedure Name: Intubation Date/Time: 01/11/2024 2:49 PM  Performed by: Genia Camie BIRCH, CRNAPre-anesthesia Checklist: Patient identified, Emergency Drugs available, Suction available, Patient being monitored and Timeout performed Patient Re-evaluated:Patient Re-evaluated prior to induction Oxygen Delivery Method: Circle system utilized Preoxygenation: Pre-oxygenation with 100% oxygen Induction Type: IV induction Ventilation: Mask ventilation without difficulty and Oral airway inserted - appropriate to patient size Laryngoscope Size: McGrath and 4 Grade View: Grade I Tube type: Oral Tube size: 7.5 mm Number of attempts: 3 Airway Equipment and Method: Stylet Placement Confirmation: ETT inserted through vocal cords under direct vision, positive ETCO2 and breath sounds checked- equal and bilateral Secured at: 22 cm Tube secured with: Tape Dental Injury: Teeth and Oropharynx as per pre-operative assessment  Comments: Grade 2-3 view with Miller 2 blade, attempted x2, then switched to MacGrath 4 without any difficulty

## 2024-01-13 LAB — SURGICAL PATHOLOGY

## 2024-01-18 NOTE — Anesthesia Postprocedure Evaluation (Signed)
 Anesthesia Post Note  Patient: Nathaniel Cox  Procedure(s) Performed: XI ROBOTIC ASSISTED LAPAROSCOPIC CHOLECYSTECTOMY INDOCYANINE GREEN  FLUORESCENCE IMAGING (ICG)  Patient location during evaluation: PACU Anesthesia Type: General Level of consciousness: awake and alert Pain management: pain level controlled Vital Signs Assessment: post-procedure vital signs reviewed and stable Respiratory status: spontaneous breathing, nonlabored ventilation, respiratory function stable and patient connected to nasal cannula oxygen Cardiovascular status: blood pressure returned to baseline and stable Postop Assessment: no apparent nausea or vomiting Anesthetic complications: no   No notable events documented.   Last Vitals:  Vitals:   01/11/24 1708 01/11/24 1712  BP: 97/80 127/75  Pulse: 72 77  Resp: 13 18  Temp: (!) 36.1 C 36.5 C  SpO2: 97% 95%    Last Pain:  Vitals:   01/12/24 0819  TempSrc:   PainSc: 2                  Prentice Murphy

## 2024-01-23 NOTE — Progress Notes (Unsigned)
[facility] POST-OP OFFICE VISIT  01/24/2024  HPI: Nathaniel Cox is a 53 y.o. male had surgery on  Jan 11, 2024 , now s/p robotic cholecystectomy.   Has done very well from the surgery side of things.  Just has some lingering subcostal/costal pain which has been improving over the last week.  Denies fevers and chills, denies nausea and vomiting, reports good bowel activity.  Vital signs: BP (!) 144/76   Pulse 72   Temp 97.9 F (36.6 C)   Ht 5\' 10"  (1.778 m)   Wt 214 lb (97.1 kg)   SpO2 97%   BMI 30.71 kg/m    Physical Exam: Constitutional: He appears well. Abdomen: Abdomen remains soft and nontender, there is a degree of tenderness immediately adjacent to the right costal margin.  Suspect there may be a degree of costal inflammation perhaps from some pressure during surgery.  Nothing suspicious of biloma. Skin: Incisions are clean dry and intact.  There is no guarding or tenderness throughout the abdomen.  Assessment/Plan: This is a 53 y.o. male status post robotic cholecystectomy from January 11, 2024.  Progressing well with mild lingering right costal discomfort, improving with time without significant medication consistent with a costal inflammatory process.  Patient Active Problem List   Diagnosis Date Noted   CCC (chronic calculous cholecystitis) 01/05/2024   Screening for colon cancer 10/08/2022   Gastric erythema 10/08/2022   Cecal polyp 10/08/2022   Adenomatous polyp of transverse colon 10/08/2022   Rectal polyp 10/08/2022   Hypogonadism in male 11/19/2021   Acanthosis nigricans 08/07/2021   Essential hypertension 08/07/2021   Uncontrolled type 2 diabetes mellitus with hyperglycemia (HCC) 08/07/2021   Swelling of finger of right hand 12/02/2017   Gastrocnemius muscle strain, left, initial encounter 10/26/2016   Combined arterial insufficiency and corporo-venous occlusive erectile dysfunction 04/28/2016   Onychocryptosis 05/27/2015   Depression  10/22/2011   Hemochromatosis 10/22/2011   History of constipation 10/22/2011   Hyperlipidemia 10/22/2011   Microalbuminuria 10/22/2011   NASH (nonalcoholic steatohepatitis) 10/22/2011   OSA (obstructive sleep apnea) 10/22/2011   Type 2 diabetes mellitus without complication, with long-term current use of insulin (HCC) 10/22/2011    -Encouraged him to follow-up with [place] if this does not continue to improve over the next 2 to 3 weeks.  Otherwise may resume his usual activity as desired.  We glad to see this gentleman again as needed.   Campbell Lerner M.D., FACS 01/24/2024, 11:43 AM

## 2024-01-24 ENCOUNTER — Ambulatory Visit (INDEPENDENT_AMBULATORY_CARE_PROVIDER_SITE_OTHER): Payer: Managed Care, Other (non HMO) | Admitting: Surgery

## 2024-01-24 ENCOUNTER — Encounter: Payer: Self-pay | Admitting: Surgery

## 2024-01-24 VITALS — BP 144/76 | HR 72 | Temp 97.9°F | Ht 70.0 in | Wt 214.0 lb

## 2024-01-24 DIAGNOSIS — K8010 Calculus of gallbladder with chronic cholecystitis without obstruction: Secondary | ICD-10-CM

## 2024-01-24 DIAGNOSIS — Z09 Encounter for follow-up examination after completed treatment for conditions other than malignant neoplasm: Secondary | ICD-10-CM

## 2024-01-24 DIAGNOSIS — Z9049 Acquired absence of other specified parts of digestive tract: Secondary | ICD-10-CM | POA: Insufficient documentation

## 2024-01-24 NOTE — Patient Instructions (Signed)

## 2024-01-26 ENCOUNTER — Encounter: Payer: Managed Care, Other (non HMO) | Admitting: Surgery

## 2024-02-07 ENCOUNTER — Other Ambulatory Visit: Payer: Self-pay | Admitting: Family

## 2024-02-07 ENCOUNTER — Encounter: Payer: Self-pay | Admitting: Family

## 2024-02-07 ENCOUNTER — Telehealth: Payer: Self-pay | Admitting: Family

## 2024-02-07 MED ORDER — OZEMPIC (0.25 OR 0.5 MG/DOSE) 2 MG/3ML ~~LOC~~ SOPN: PEN_INJECTOR | SUBCUTANEOUS | 0 refills | Status: DC

## 2024-02-07 NOTE — Telephone Encounter (Signed)
-----   Message from Campbell Lerner sent at 01/31/2024 10:08 AM EST ----- Thanks for your patience, I don't have a strong opinion re: his resumption of Ozempic.   Perhaps I should, but I believe we can resume safely with caution/observation.  Thanks for the privilege to care for your patients.   Katherina Right ----- Message ----- From: Olive Bass, FNP Sent: 01/27/2024   2:48 PM EST To: Campbell Lerner, MD  Dr. Claudine Mouton,  I was reaching out to your about our mutual patient, Nathaniel Cox. Thank you for taking such good care of him. He is interested in going back on Ozempic. I wanted to touch base with you about this option. I did not see any signs of pancreatitis when I saw him but appreciate your input and want to be safe.   Thank you again,  Ria Clock, FNP

## 2024-03-04 ENCOUNTER — Other Ambulatory Visit: Payer: Self-pay | Admitting: Family

## 2024-03-13 ENCOUNTER — Other Ambulatory Visit: Payer: Self-pay | Admitting: Family

## 2024-04-07 ENCOUNTER — Other Ambulatory Visit: Payer: Self-pay | Admitting: Family

## 2024-04-10 ENCOUNTER — Other Ambulatory Visit: Payer: Self-pay

## 2024-04-10 MED ORDER — OZEMPIC (0.25 OR 0.5 MG/DOSE) 2 MG/3ML ~~LOC~~ SOPN: 0.5000 mg | PEN_INJECTOR | SUBCUTANEOUS | 0 refills | Status: DC

## 2024-05-22 ENCOUNTER — Encounter: Payer: Self-pay | Admitting: Family

## 2024-05-22 ENCOUNTER — Ambulatory Visit (INDEPENDENT_AMBULATORY_CARE_PROVIDER_SITE_OTHER): Admitting: Family

## 2024-05-22 VITALS — BP 132/74 | HR 63 | Ht 70.0 in | Wt 213.2 lb

## 2024-05-22 DIAGNOSIS — Z125 Encounter for screening for malignant neoplasm of prostate: Secondary | ICD-10-CM | POA: Diagnosis not present

## 2024-05-22 DIAGNOSIS — Z794 Long term (current) use of insulin: Secondary | ICD-10-CM

## 2024-05-22 DIAGNOSIS — E119 Type 2 diabetes mellitus without complications: Secondary | ICD-10-CM | POA: Diagnosis not present

## 2024-05-22 DIAGNOSIS — Z23 Encounter for immunization: Secondary | ICD-10-CM

## 2024-05-22 DIAGNOSIS — Z7985 Long-term (current) use of injectable non-insulin antidiabetic drugs: Secondary | ICD-10-CM

## 2024-05-22 DIAGNOSIS — F32A Depression, unspecified: Secondary | ICD-10-CM

## 2024-05-22 LAB — MICROALBUMIN / CREATININE URINE RATIO
Creatinine,U: 180.7 mg/dL
Microalb Creat Ratio: 41.7 mg/g — ABNORMAL HIGH (ref 0.0–30.0)
Microalb, Ur: 7.5 mg/dL — ABNORMAL HIGH (ref 0.0–1.9)

## 2024-05-22 MED ORDER — ESCITALOPRAM OXALATE 20 MG PO TABS: 20.0000 mg | ORAL_TABLET | Freq: Every day | ORAL | 3 refills | Status: AC

## 2024-05-22 MED ORDER — SEMAGLUTIDE (1 MG/DOSE) 4 MG/3ML ~~LOC~~ SOPN: 1.0000 mg | PEN_INJECTOR | SUBCUTANEOUS | 3 refills | Status: DC

## 2024-05-22 MED ORDER — ROSUVASTATIN CALCIUM 10 MG PO TABS: 10.0000 mg | ORAL_TABLET | Freq: Every day | ORAL | 3 refills | Status: AC

## 2024-05-22 NOTE — Progress Notes (Signed)
 Nathaniel Cox is a 53 y.o. male with the following history as recorded in EpicCare:  Patient Active Problem List   Diagnosis Date Noted   Status post laparoscopic cholecystectomy 01/24/2024   Screening for colon cancer 10/08/2022   Gastric erythema 10/08/2022   Cecal polyp 10/08/2022   Adenomatous polyp of transverse colon 10/08/2022   Rectal polyp 10/08/2022   Hypogonadism in male 11/19/2021   Acanthosis nigricans 08/07/2021   Essential hypertension 08/07/2021   Uncontrolled type 2 diabetes mellitus with hyperglycemia (HCC) 08/07/2021   Swelling of finger of right hand 12/02/2017   Gastrocnemius muscle strain, left, initial encounter 10/26/2016   Combined arterial insufficiency and corporo-venous occlusive erectile dysfunction 04/28/2016   Onychocryptosis 05/27/2015   Depression 10/22/2011   Hemochromatosis 10/22/2011   History of constipation 10/22/2011   Hyperlipidemia 10/22/2011   Microalbuminuria 10/22/2011   NASH (nonalcoholic steatohepatitis) 10/22/2011   OSA (obstructive sleep apnea) 10/22/2011   Type 2 diabetes mellitus without complication, with long-term current use of insulin  (HCC) 10/22/2011    Current Outpatient Medications  Medication Sig Dispense Refill   glucose blood test strip Use as instructed to check blood sugar 100 each 12   lisinopril  (ZESTRIL ) 5 MG tablet Take 1 tablet (5 mg total) by mouth daily. 90 tablet 3   metFORMIN  (GLUCOPHAGE ) 1000 MG tablet Take 1 tablet (1,000 mg total) by mouth 2 (two) times daily with a meal. 180 tablet 3   Semaglutide , 1 MG/DOSE, 4 MG/3ML SOPN Inject 1 mg as directed once a week. 3 mL 3   escitalopram  (LEXAPRO ) 20 MG tablet Take 1 tablet (20 mg total) by mouth daily. 90 tablet 3   rosuvastatin  (CRESTOR ) 10 MG tablet Take 1 tablet (10 mg total) by mouth daily. 90 tablet 3   No current facility-administered medications for this visit.    Allergies: Atorvastatin  Past Medical History:  Diagnosis Date   CCC (chronic calculous  cholecystitis) 01/05/2024   Chronic cholecystitis with calculus    Cirrhosis (HCC)    Combined arterial insufficiency and corporo-venous occlusive erectile dysfunction 04/28/2016   Depression 10/22/2011   Essential hypertension 08/07/2021   Gastrocnemius muscle strain, left, initial encounter 10/26/2016   Hemochromatosis 10/22/2011   Formatting of this note might be different from the original. Managed by [facility]; diagnosis has been questioned as LFTs have normalized without need for continued blood draws   History of constipation 10/22/2011   Hyperlipidemia    Hypertension    Hypogonadism in male 11/19/2021   Microalbuminuria 10/22/2011   NASH (nonalcoholic steatohepatitis) 10/22/2011   Formatting of this note might be different from the original. Followed by [facility]   Onychocryptosis 05/27/2015   OSA (obstructive sleep apnea) 10/22/2011   Formatting of this note might be different from the original. Apr 25, 2013:  Polysomnography study at [facility] revealed mild obstructive sleep apnea with an overall index of 14 events per our.  Oxygen saturation minimum 77%.  Epworth sleepiness Scale score:  22/24.  BMI:  34. Neck size:  16.5 in.   Swelling of finger of right hand 12/02/2017   Type 2 diabetes mellitus without complication, with long-term current use of insulin  (HCC) 10/22/2011   Uncontrolled type 2 diabetes mellitus with hyperglycemia (HCC) 08/07/2021    Past Surgical History:  Procedure Laterality Date   COLONOSCOPY WITH PROPOFOL  N/A 10/08/2022   Procedure: COLONOSCOPY WITH PROPOFOL ;  Surgeon: Selena Daily, MD;  Location: [facility] ENDOSCOPY;  Service: Gastroenterology;  Laterality: N/A;   HAND SURGERY Right  Family History  Problem Relation Age of Onset   Liver cancer Mother    Liver disease Mother    Lung cancer Mother    Hyperlipidemia Father    Hypertension Father    Coronary artery disease Father    Coronary artery disease Brother     Social History    Tobacco Use   Smoking status: Never    Passive exposure: Never   Smokeless tobacco: Never  Substance Use Topics   Alcohol use: Never    Subjective:   Follow up on Type 2 Diabetes; currently on Ozempic  and Metformin ; no concerns today; Denies any chest pain, shortness of breath, blurred vision or headache; seeing liver specialist every 6 months;  Needs form completed for his employer; would like to get any needed vaccines updated;  Does see ophthalmologist regularly- no concerns for diabetes changes in back of eyes;   Objective:  Vitals:   05/22/24 0828  BP: 132/74  Pulse: 63  SpO2: 95%  Weight: 213 lb 3.2 oz (96.7 kg)  Height: 5' 10 (1.778 m)    General: Well developed, well nourished, in no acute distress  Skin : Warm and dry.  Head: Normocephalic and atraumatic  Eyes: Sclera and conjunctiva clear; pupils round and reactive to light; extraocular movements intact  Ears: External normal; canals clear; tympanic membranes normal  Oropharynx: Pink, supple. No suspicious lesions  Neck: Supple without thyromegaly, adenopathy  Lungs: Respirations unlabored; clear to auscultation bilaterally without wheeze, rales, rhonchi  CVS exam: normal rate and regular rhythm.  Neurologic: Alert and oriented; speech intact; face symmetrical; moves all extremities well; CNII-XII intact without focal deficit   Assessment:  1. Type 2 diabetes mellitus without complication, with long-term current use of insulin  (HCC)   2. Prostate cancer screening   3. Depression, unspecified depression type     Plan:  Will increase Ozempic  to 1 mg weekly; continue Metformin ; check CBC, CMP, hgba1c today; 2.   Check PSA; 3.   Stable; refill updated on Lexapro ;    Shingrix #1 and Prevnar 20 updated today; will plan for Shingrix #2 at next OV;   Return in about 4 months (around 09/21/2024) for follow up.  Orders Placed This Encounter  Procedures   CBC with Differential/Platelet   Comp Met (CMET)   Lipid  panel   Hemoglobin A1c   PSA   Urine Microalbumin w/creat. ratio    Requested Prescriptions   Signed Prescriptions Disp Refills   Semaglutide , 1 MG/DOSE, 4 MG/3ML SOPN 3 mL 3    Sig: Inject 1 mg as directed once a week.   rosuvastatin  (CRESTOR ) 10 MG tablet 90 tablet 3    Sig: Take 1 tablet (10 mg total) by mouth daily.   escitalopram  (LEXAPRO ) 20 MG tablet 90 tablet 3    Sig: Take 1 tablet (20 mg total) by mouth daily.

## 2024-05-23 ENCOUNTER — Ambulatory Visit: Payer: Self-pay | Admitting: Family

## 2024-05-23 LAB — COMPREHENSIVE METABOLIC PANEL WITH GFR
ALT: 120 IU/L — ABNORMAL HIGH (ref 0–44)
AST: 79 IU/L — ABNORMAL HIGH (ref 0–40)
Albumin: 4.5 g/dL (ref 3.8–4.9)
Alkaline Phosphatase: 81 IU/L (ref 44–121)
BUN/Creatinine Ratio: 16 (ref 9–20)
BUN: 14 mg/dL (ref 6–24)
Bilirubin Total: 0.6 mg/dL (ref 0.0–1.2)
CO2: 22 mmol/L (ref 20–29)
Calcium: 10.1 mg/dL (ref 8.7–10.2)
Chloride: 100 mmol/L (ref 96–106)
Creatinine, Ser: 0.88 mg/dL (ref 0.76–1.27)
Globulin, Total: 3.7 g/dL (ref 1.5–4.5)
Glucose: 147 mg/dL — ABNORMAL HIGH (ref 70–99)
Potassium: 4.6 mmol/L (ref 3.5–5.2)
Sodium: 137 mmol/L (ref 134–144)
Total Protein: 8.2 g/dL (ref 6.0–8.5)
eGFR: 102 mL/min/{1.73_m2} (ref >59)

## 2024-05-23 LAB — CBC WITH DIFFERENTIAL/PLATELET
Basophils Absolute: 0 10*3/uL (ref 0.0–0.2)
Basos: 1 %
EOS (ABSOLUTE): 0.7 10*3/uL — ABNORMAL HIGH (ref 0.0–0.4)
Eos: 11 %
Hematocrit: 42.6 % (ref 37.5–51.0)
Hemoglobin: 14.3 g/dL (ref 13.0–17.7)
Immature Grans (Abs): 0 10*3/uL (ref 0.0–0.1)
Immature Granulocytes: 0 %
Lymphocytes Absolute: 2 10*3/uL (ref 0.7–3.1)
Lymphs: 31 %
MCH: 31.8 pg (ref 26.6–33.0)
MCHC: 33.6 g/dL (ref 31.5–35.7)
MCV: 95 fL (ref 79–97)
Monocytes Absolute: 0.6 10*3/uL (ref 0.1–0.9)
Monocytes: 9 %
Neutrophils Absolute: 3.1 10*3/uL (ref 1.4–7.0)
Neutrophils: 48 %
Platelets: 269 10*3/uL (ref 150–450)
RBC: 4.5 x10E6/uL (ref 4.14–5.80)
RDW: 12.9 % (ref 11.6–15.4)
WBC: 6.4 10*3/uL (ref 3.4–10.8)

## 2024-05-23 LAB — LIPID PANEL
Chol/HDL Ratio: 3.7 ratio (ref 0.0–5.0)
Cholesterol, Total: 158 mg/dL (ref 100–199)
HDL: 43 mg/dL (ref >39)
LDL Chol Calc (NIH): 90 mg/dL (ref 0–99)
Triglycerides: 141 mg/dL (ref 0–149)
VLDL Cholesterol Cal: 25 mg/dL (ref 5–40)

## 2024-05-23 LAB — HEMOGLOBIN A1C
Est. average glucose Bld gHb Est-mCnc: 157 mg/dL
Hgb A1c MFr Bld: 7.1 % — ABNORMAL HIGH (ref 4.8–5.6)

## 2024-05-23 LAB — PSA: Prostate Specific Ag, Serum: 1.1 ng/mL (ref 0.0–4.0)

## 2024-06-27 ENCOUNTER — Other Ambulatory Visit: Payer: Self-pay | Admitting: Nurse Practitioner

## 2024-06-27 DIAGNOSIS — K7469 Other cirrhosis of liver: Secondary | ICD-10-CM

## 2024-06-27 DIAGNOSIS — K7581 Nonalcoholic steatohepatitis (NASH): Secondary | ICD-10-CM

## 2024-07-20 ENCOUNTER — Ambulatory Visit
Admission: RE | Admit: 2024-07-20 | Discharge: 2024-07-20 | Disposition: A | Discharge status: Home / Self Care | Source: Ambulatory Visit | Attending: Nurse Practitioner | Admitting: Nurse Practitioner

## 2024-07-20 DIAGNOSIS — K7581 Nonalcoholic steatohepatitis (NASH): Secondary | ICD-10-CM | POA: Insufficient documentation

## 2024-07-20 DIAGNOSIS — K7469 Other cirrhosis of liver: Secondary | ICD-10-CM | POA: Diagnosis present

## 2024-07-30 ENCOUNTER — Encounter: Payer: Self-pay | Admitting: Family

## 2024-07-31 ENCOUNTER — Other Ambulatory Visit: Payer: Self-pay | Admitting: Family

## 2024-07-31 MED ORDER — SEMAGLUTIDE (2 MG/DOSE) 8 MG/3ML ~~LOC~~ SOPN: 2.0000 mg | PEN_INJECTOR | SUBCUTANEOUS | 1 refills | Status: DC

## 2024-09-21 ENCOUNTER — Ambulatory Visit (INDEPENDENT_AMBULATORY_CARE_PROVIDER_SITE_OTHER): Admitting: Family Medicine

## 2024-09-21 ENCOUNTER — Ambulatory Visit: Admitting: Family

## 2024-09-21 VITALS — BP 126/72 | HR 65 | Ht 70.0 in | Wt 217.0 lb

## 2024-09-21 DIAGNOSIS — Z794 Long term (current) use of insulin: Secondary | ICD-10-CM

## 2024-09-21 DIAGNOSIS — E119 Type 2 diabetes mellitus without complications: Secondary | ICD-10-CM | POA: Diagnosis not present

## 2024-09-21 DIAGNOSIS — K7581 Nonalcoholic steatohepatitis (NASH): Secondary | ICD-10-CM | POA: Diagnosis not present

## 2024-09-21 DIAGNOSIS — Z23 Encounter for immunization: Secondary | ICD-10-CM | POA: Diagnosis not present

## 2024-09-21 DIAGNOSIS — I10 Essential (primary) hypertension: Secondary | ICD-10-CM

## 2024-09-21 DIAGNOSIS — E785 Hyperlipidemia, unspecified: Secondary | ICD-10-CM

## 2024-09-21 DIAGNOSIS — Z0000 Encounter for general adult medical examination without abnormal findings: Secondary | ICD-10-CM

## 2024-09-21 DIAGNOSIS — F32A Depression, unspecified: Secondary | ICD-10-CM | POA: Diagnosis not present

## 2024-09-21 NOTE — Assessment & Plan Note (Signed)
 Medication management: rosuvastatin  10 mg daily Lifestyle factors for lowering cholesterol include: Diet therapy - heart-healthy diet rich in fruits, veggies, fiber-rich whole grains, lean meats, chicken, fish (at least twice a week), fat-free or 1% dairy products; foods low in saturated/trans fats, cholesterol, sodium, and sugar. Mediterranean diet has shown to be very heart healthy. Regular exercise - recommend at least 30 minutes a day, 5 times per week

## 2024-09-21 NOTE — Progress Notes (Signed)
 Established Patient Office Visit  Subjective   Patient ID: Nathaniel Cox, male    DOB: September 04, 1971  Age: 53 y.o. MRN: [id number]  Chief Complaint  Patient presents with   Establish Care    HPI    Lives with wife and daughter (59 years old). He works as a Naval architect for [employer].   Hypertension: - Medications: lisinopril  5 mg daily  - Compliance: good - Checking BP at home:  - Denies any SOB, recurrent headaches, CP, vision changes, LE edema, dizziness, palpitations, or medication side effects.   Diabetes: - Checking glucose at home: usually controlled - Medications: metformin  1,000 mg BID - Compliance: good - Eye exam: request records - Foot exam:  - Microalbumin: UTD - Denies symptoms of hypoglycemia, polyuria, polydipsia, numbness extremities, foot ulcers/trauma, wounds that are not healing, medication side effects  Lab Results  Component Value Date   HGBA1C 7.1 (H) 05/22/2024     Hyperlipidemia: - medications: rosuvastatin  10 mg daily - compliance: good  - medication SEs: none The 10-year ASCVD risk score (Arnett DK, et al., 2019) is: 9.5%   Values used to calculate the score:     Age: 60 years     Clincally relevant sex: Male     Is Non-Hispanic African American: No     Diabetic: Yes     Tobacco smoker: No     Systolic Blood Pressure: 126 mmHg     Is BP treated: Yes     HDL Cholesterol: 43 mg/dL     Total Cholesterol: 158 mg/dL   Mood follow-up: - Diagnosis: depression - Treatment: Lexapro  20 mg daily - Medication side effects: none - SI/HI: none - Update: stable    MASH, cirrhosis, hereditary hemochromatosis, portal hypertension: - Following Dawn Drazek at liver clinic          ROS All review of systems negative except what is listed in the HPI    Objective:     BP 126/72   Pulse 65   Ht 5' 10 (1.778 m)   Wt 217 lb (98.4 kg)   SpO2 100%   BMI 31.14 kg/m    Physical Exam Vitals reviewed.   Constitutional:      Appearance: Normal appearance.  Cardiovascular:     Rate and Rhythm: Normal rate and regular rhythm.     Heart sounds: Normal heart sounds.  Pulmonary:     Effort: Pulmonary effort is normal.     Breath sounds: Normal breath sounds.  Skin:    General: Skin is warm and dry.  Neurological:     Mental Status: He is alert and oriented to person, place, and time.  Psychiatric:        Mood and Affect: Mood normal.        Behavior: Behavior normal.        Thought Content: Thought content normal.        Judgment: Judgment normal.        No results found for any visits on 09/21/24.    The 10-year ASCVD risk score (Arnett DK, et al., 2019) is: 9.5%    Assessment & Plan:   Problem List Items Addressed This Visit       Active Problems   Depression   PHQ/GAD reviewed. No SI/HI. Stable on Lexapro .       Essential hypertension   Blood pressure is at goal for age and co-morbidities.   Recommendations: lisinopril  5 mg daily  - BP goal <130/80 -  monitor and log blood pressures at home - check around the same time each day in a relaxed setting - Limit salt to <2000 mg/day - Follow DASH eating plan (heart healthy diet) - limit alcohol to 2 standard drinks per day for men and 1 per day for women - avoid tobacco products - get at least 2 hours of regular aerobic exercise weekly Patient aware of signs/symptoms requiring further/urgent evaluation.       Hyperlipidemia   Medication management: rosuvastatin  10 mg daily Lifestyle factors for lowering cholesterol include: Diet therapy - heart-healthy diet rich in fruits, veggies, fiber-rich whole grains, lean meats, chicken, fish (at least twice a week), fat-free or 1% dairy products; foods low in saturated/trans fats, cholesterol, sodium, and sugar. Mediterranean diet has shown to be very heart healthy. Regular exercise - recommend at least 30 minutes a day, 5 times per week        NASH (nonalcoholic  steatohepatitis)   Following with liver clinic      Type 2 diabetes mellitus without complication, with long-term current use of insulin  (HCC)   Last A1c 7.1% Continue metformin  Continue low carb diet and regular exercise Recheck A1c      Relevant Orders   Hemoglobin A1c   Comprehensive metabolic panel with GFR   Other Visit Diagnoses       Encounter for medical examination to establish care    -  Primary     Immunization due       Relevant Orders   Flu vaccine trivalent PF, 6mos and older(Flulaval,Afluria,Fluarix,Fluzone) (Completed)   Varicella-zoster vaccine IM (Completed)         Return in about 6 months (around 03/22/2025) for chronic disease management.    Waddell KATHEE Mon, NP

## 2024-09-21 NOTE — Assessment & Plan Note (Signed)
 Following with liver clinic

## 2024-09-21 NOTE — Assessment & Plan Note (Signed)
 PHQ/GAD reviewed. No SI/HI. Stable on Lexapro .

## 2024-09-21 NOTE — Assessment & Plan Note (Signed)
 Last A1c 7.1% Continue metformin  Continue low carb diet and regular exercise Recheck A1c

## 2024-09-21 NOTE — Assessment & Plan Note (Signed)
 Blood pressure is at goal for age and co-morbidities.   Recommendations: lisinopril  5 mg daily - BP goal <130/80 - monitor and log blood pressures at home - check around the same time each day in a relaxed setting - Limit salt to <2000 mg/day - Follow DASH eating plan (heart healthy diet) - limit alcohol to 2 standard drinks per day for men and 1 per day for women - avoid tobacco products - get at least 2 hours of regular aerobic exercise weekly Patient aware of signs/symptoms requiring further/urgent evaluation.

## 2024-09-22 LAB — COMPREHENSIVE METABOLIC PANEL WITH GFR
ALT: 88 IU/L — ABNORMAL HIGH (ref 0–44)
AST: 50 IU/L — ABNORMAL HIGH (ref 0–40)
Albumin: 4.6 g/dL (ref 3.8–4.9)
Alkaline Phosphatase: 75 IU/L (ref 47–123)
BUN/Creatinine Ratio: 12 (ref 9–20)
BUN: 12 mg/dL (ref 6–24)
Bilirubin Total: 0.7 mg/dL (ref 0.0–1.2)
CO2: 24 mmol/L (ref 20–29)
Calcium: 10.2 mg/dL (ref 8.7–10.2)
Chloride: 102 mmol/L (ref 96–106)
Creatinine, Ser: 0.98 mg/dL (ref 0.76–1.27)
Globulin, Total: 3.2 g/dL (ref 1.5–4.5)
Glucose: 110 mg/dL — ABNORMAL HIGH (ref 70–99)
Potassium: 4.9 mmol/L (ref 3.5–5.2)
Sodium: 139 mmol/L (ref 134–144)
Total Protein: 7.8 g/dL (ref 6.0–8.5)
eGFR: 92 mL/min/1.73 (ref >59)

## 2024-09-22 LAB — HEMOGLOBIN A1C
Est. average glucose Bld gHb Est-mCnc: 134 mg/dL
Hgb A1c MFr Bld: 6.3 % — ABNORMAL HIGH (ref 4.8–5.6)

## 2024-09-24 ENCOUNTER — Ambulatory Visit: Payer: Self-pay | Admitting: Family Medicine

## 2024-09-24 ENCOUNTER — Encounter: Payer: Self-pay | Admitting: Neurology

## 2024-09-25 ENCOUNTER — Telehealth: Payer: Self-pay

## 2024-09-25 ENCOUNTER — Other Ambulatory Visit (HOSPITAL_COMMUNITY): Payer: Self-pay

## 2024-09-25 NOTE — Telephone Encounter (Signed)
 Pharmacy Patient Advocate Encounter   Received notification from CoverMyMeds that prior authorization for Ozempic  (2 MG/DOSE) 8MG /3ML pen-injectors is required/requested.   Insurance verification completed.   The patient is insured through [facility].   Per test claim: PA required; PA submitted to above mentioned insurance via Latent Key/confirmation #/EOC [id number] Status is pending

## 2024-09-25 NOTE — Telephone Encounter (Signed)
 Pharmacy Patient Advocate Encounter  Received notification from [employer] that Prior Authorization for Ozempic  (2 MG/DOSE) 8MG /3ML pen-injectors   has been APPROVED from 09/25/2024 to 09/25/2025   PA #/Case ID/Reference #: [id number]

## 2024-10-10 ENCOUNTER — Other Ambulatory Visit: Payer: Self-pay | Admitting: Family Medicine

## 2024-10-10 ENCOUNTER — Encounter: Payer: Self-pay | Admitting: Family Medicine

## 2024-10-10 MED ORDER — SEMAGLUTIDE (2 MG/DOSE) 8 MG/3ML ~~LOC~~ SOPN: 2.0000 mg | PEN_INJECTOR | SUBCUTANEOUS | 1 refills | Status: DC

## 2024-10-10 NOTE — Telephone Encounter (Signed)
 Copied from CRM [id number]. Topic: Clinical - Medication Refill >> Oct 10, 2024 11:29 AM Franky GRADE wrote: Medication: Semaglutide , 2 MG/DOSE, 8 MG/3ML SOPN [[id number]]  Has the patient contacted their pharmacy? Yes, pharmacy is calling to place the request (Agent: If no, request that the patient contact the pharmacy for the refill. If patient does not wish to contact the pharmacy document the reason why and proceed with request.) (Agent: If yes, when and what did the pharmacy advise?)  This is the patient's preferred pharmacy:  [id number] - [place], [place] - [address] AT [facility] OF [address] [facility] [address] [place] [address] Phone: [phone] Fax: [phone]  Is this the correct pharmacy for this prescription? Yes If no, delete pharmacy and type the correct one.   Has the prescription been filled recently? No  Is the patient out of the medication? Yes  Has the patient been seen for an appointment in the last year OR does the patient have an upcoming appointment? Yes  Can we respond through MyChart? Yes  Agent: Please be advised that Rx refills may take up to 3 business days. We ask that you follow-up with your pharmacy.

## 2024-11-05 ENCOUNTER — Encounter: Payer: Self-pay | Admitting: Family Medicine

## 2024-11-05 DIAGNOSIS — H9319 Tinnitus, unspecified ear: Secondary | ICD-10-CM

## 2024-11-05 NOTE — Addendum Note (Signed)
 Addended by: ALMARIE BIRMINGHAM B on: 11/05/2024 04:35 PM   Modules accepted: Orders

## 2024-12-09 ENCOUNTER — Other Ambulatory Visit: Payer: Self-pay | Admitting: Family Medicine

## 2024-12-11 ENCOUNTER — Encounter: Payer: Self-pay | Admitting: Internal Medicine

## 2024-12-11 ENCOUNTER — Encounter: Payer: Self-pay | Admitting: Family Medicine

## 2024-12-13 NOTE — Telephone Encounter (Signed)
 He has a PCP that it looks like he has been working on these issues with.  He can continue to work with his PCP until his new patient appointment with me.  He also did not give me information regarding what he needed to send to the [place].  This is not really a reason to get him in before his scheduled appointment.

## 2024-12-20 ENCOUNTER — Other Ambulatory Visit: Payer: Self-pay | Admitting: Nurse Practitioner

## 2024-12-20 DIAGNOSIS — K7469 Other cirrhosis of liver: Secondary | ICD-10-CM

## 2024-12-20 DIAGNOSIS — K766 Portal hypertension: Secondary | ICD-10-CM

## 2024-12-20 DIAGNOSIS — K7581 Nonalcoholic steatohepatitis (NASH): Secondary | ICD-10-CM

## 2024-12-26 ENCOUNTER — Encounter: Payer: Self-pay | Admitting: Family Medicine

## 2024-12-26 ENCOUNTER — Ambulatory Visit: Admitting: Family Medicine

## 2024-12-26 ENCOUNTER — Ambulatory Visit (HOSPITAL_BASED_OUTPATIENT_CLINIC_OR_DEPARTMENT_OTHER)
Admission: RE | Admit: 2024-12-26 | Discharge: 2024-12-26 | Disposition: A | Discharge status: Home / Self Care | Source: Ambulatory Visit | Attending: Family Medicine | Admitting: Family Medicine

## 2024-12-26 VITALS — BP 120/71 | HR 74 | Ht 70.0 in | Wt 215.0 lb

## 2024-12-26 DIAGNOSIS — M25561 Pain in right knee: Secondary | ICD-10-CM | POA: Diagnosis not present

## 2024-12-26 DIAGNOSIS — G8929 Other chronic pain: Secondary | ICD-10-CM | POA: Insufficient documentation

## 2024-12-26 DIAGNOSIS — R519 Headache, unspecified: Secondary | ICD-10-CM | POA: Diagnosis not present

## 2024-12-26 DIAGNOSIS — R5383 Other fatigue: Secondary | ICD-10-CM

## 2024-12-26 DIAGNOSIS — M25562 Pain in left knee: Secondary | ICD-10-CM

## 2024-12-26 DIAGNOSIS — R6882 Decreased libido: Secondary | ICD-10-CM

## 2024-12-26 DIAGNOSIS — Z794 Long term (current) use of insulin: Secondary | ICD-10-CM | POA: Diagnosis not present

## 2024-12-26 DIAGNOSIS — E119 Type 2 diabetes mellitus without complications: Secondary | ICD-10-CM | POA: Diagnosis not present

## 2024-12-26 NOTE — Assessment & Plan Note (Signed)
 Chronic daily headaches likely tension-type due to bilateral frontal location. Caffeine intake may contribute. - Referred to neurologist for further evaluation. - Provided headache prevention information. - Advised keeping a headache diary to track triggers such as caffeine intake, sleep, hydration, and meals. - Continue current over-the-counter medications as needed.

## 2024-12-26 NOTE — Assessment & Plan Note (Signed)
 Chronic bilateral knee pain likely due to past trauma and possible arthritis.  - Ordered knee x-rays. - Referred to orthopedic specialist. - Recommended Voltaren gel for topical anti-inflammatory relief. - Advised continued physical activity to promote joint lubrication. - Discussed potential for prescription NSAIDs if over-the-counter medications are inadequate.

## 2024-12-26 NOTE — Progress Notes (Signed)
 "  Acute Office Visit  Subjective:  Patient ID: Nathaniel Cox, male    DOB: [birth year]  Age: 54 y.o. MRN: [id number]  CC:  Chief Complaint  Patient presents with   Headache      HPI Nathaniel Cox is here for headaches, knee pain, fatigue.   Discussed the use of AI scribe software for clinical note transcription with the patient, who gave verbal consent to proceed.  History of Present Illness [name] is a 54 year old male who presents with daily headaches and joint pain.  He has been experiencing daily headaches, with intensity ranging from light to severe. The headaches are located in the frontal region and around the eyes, affecting both sides. He uses a migraine cap for relief and occasionally takes ibuprofen  or Excedrin, which he believes helps manage the pain. No associated nausea or vomiting.  He has a history of knee and ankle trauma from [employer] service, with persistent knee pain rated as 7 to 8 out of 10 in severity. The pain is constant and worsens when lying down, making it difficult to get comfortable. He has not had any imaging studies since his [place]. He uses stretching and repositioning for relief but has not tried any topical treatments.  He has a history of liver issues and is currently under the care of a liver specialist. His recent FibroScan showed improvement this year. He is on Ozempic  for diabetes management, which he finds effective in controlling his blood sugar levels.  He reports feeling fatigued and experiencing decreased libido, which he attributes to potential low testosterone  levels. He is currently taking Lexapro  and has discussed the possibility of adding Wellbutrin with his liver specialist, who approved its use.  His social history includes participation in a [employer] workout group called [employer], which he attends regularly in the mornings. He lives in [place] and works as a naval architect. He consumes four to five cups  of coffee in the morning and stops by 11 AM. He uses glasses with a blue light filter and spends a significant amount of time looking at screens.       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 Managed by [facility]; diagnosis has been questioned as LFTs have normalized without need for continued blood draws   History of constipation 10/22/2011   Hyperlipidemia    Hypertension    Hypogonadism in male 11/19/2021   Microalbuminuria 10/22/2011   NASH (nonalcoholic steatohepatitis) 10/22/2011   Formatting of this note might be different from the original. Followed by [facility]   Onychocryptosis 05/27/2015   OSA (obstructive sleep apnea) 10/22/2011   Formatting of this note might be different from the original. Apr 25, 2013:  Polysomnography study at [facility] revealed mild obstructive sleep apnea with an overall index of 14 events per our.  Oxygen saturation minimum 77%.  Epworth sleepiness Scale score:  22/24.  BMI:  34. Neck size:  16.5 in.   Swelling of finger of right hand 12/02/2017   Type 2 diabetes mellitus without complication, with long-term current use of insulin  (HCC) 10/22/2011   Uncontrolled type 2  diabetes mellitus with hyperglycemia (HCC) 08/07/2021    Past Surgical History:  Procedure Laterality Date   CHOLECYSTECTOMY  12/2023   COLONOSCOPY WITH PROPOFOL  N/A 10/08/2022   Procedure: COLONOSCOPY WITH PROPOFOL ;  Surgeon: Unk Corinn Skiff, MD;  Location: [facility] ENDOSCOPY;  Service: Gastroenterology;  Laterality: N/A;   HAND SURGERY Right     Family  History  Problem Relation Age of Onset   Liver cancer Mother    Liver disease Mother    Lung cancer Mother    Cancer Mother    Hyperlipidemia Father    Hypertension Father    Coronary artery disease Father    Heart disease Father    Coronary artery disease Brother    Kidney disease Brother     Social History   Socioeconomic History   Marital status: Married    Spouse name: Not on file   Number of children: Not on file   Years of education: Not on file   Highest education level: Associate degree: occupational, scientist, product/process development, or vocational program  Occupational History   Not on file  Tobacco Use   Smoking status: Never    Passive exposure: Never   Smokeless tobacco: Never  Vaping Use   Vaping status: Never Used  Substance and Sexual Activity   Alcohol use: Never   Drug use: Never   Sexual activity: Yes  Other Topics Concern   Not on file  Social History Narrative   Not on file   Social Drivers of Health   Tobacco Use: Low Risk (12/26/2024)   Patient History    Smoking Tobacco Use: Never    Smokeless Tobacco Use: Never    Passive Exposure: Never  Financial Resource Strain: Low Risk (09/21/2024)   Overall Financial Resource Strain (CARDIA)    Difficulty of Paying Living Expenses: Not hard at all  Food Insecurity: No Food Insecurity (09/21/2024)   Epic    Worried About Programme Researcher, Broadcasting/film/video in the Last Year: Never true    Ran Out of Food in the Last Year: Never true  Transportation Needs: No Transportation Needs (09/21/2024)   Epic    Lack of Transportation (Medical): No    Lack of Transportation (Non-Medical): No  Physical Activity: Insufficiently Active (09/21/2024)   Exercise Vital Sign    Days of Exercise per Week: 3 days    Minutes of Exercise per Session: 40 min  Stress: No Stress Concern Present (09/21/2024)   [employer] of Occupational Health - Occupational Stress Questionnaire    Feeling of Stress: Not at all  Social Connections: Socially Integrated  (09/21/2024)   Social Connection and Isolation Panel    Frequency of Communication with Friends and Family: More than three times a week    Frequency of Social Gatherings with Friends and Family: Twice a week    Attends Religious Services: More than 4 times per year    Active Member of [employer] or Organizations: Yes    Attends Banker Meetings: More than 4 times per year    Marital Status: Married  Catering Manager Violence: Not on file  Depression (PHQ2-9): Low Risk (12/26/2024)   Depression (PHQ2-9)    PHQ-2 Score: 2  Alcohol Screen: Not on file  Housing: Low Risk (09/21/2024)   Epic    Unable to Pay for Housing in the Last Year: No    Number of Times Moved in the Last Year: 0    Homeless in the Last Year: No  Utilities: Low Risk (06/27/2024)  Received from [facility]   Utilities    In the past 12 months has the electric, gas, oil, or water company threatened to shut off services in your home? : No  Health Literacy: Not on file    ROS All ROS negative except what is listed in the HPI.   Objective:   Today's Vitals: BP 120/71   Pulse 74   Ht 5' 10 (1.778 m)   Wt 215 lb (97.5 kg)   SpO2 100%   BMI 30.85 kg/m   Physical Exam Vitals reviewed.  Constitutional:      Appearance: Normal appearance.  Cardiovascular:     Rate and Rhythm: Normal rate and regular rhythm.     Heart sounds: Normal heart sounds.  Pulmonary:     Effort: Pulmonary effort is normal.     Breath sounds: Normal breath sounds.  Musculoskeletal:     Cervical back: Normal range of motion.  Skin:    General: Skin is warm and dry.  Neurological:     Mental Status: He is alert and oriented to person, place, and time.     Cranial Nerves: No dysarthria or facial asymmetry.     Gait: Gait normal.  Psychiatric:        Mood and Affect: Mood normal.        Behavior: Behavior normal.        Thought Content: Thought content normal.        Judgment: Judgment normal.         Assessment &  Plan:   Problem List Items Addressed This Visit       Active Problems   Type 2 diabetes mellitus without complication, with long-term current use of insulin  (HCC)   Last A1c 6.3% Continue metformin  1,000 mg BID and Ozempic  2 mg weekly  Continue low carb diet and regular exercise Recheck A1c      Relevant Orders   HgB A1c   Chronic pain of both knees   Chronic bilateral knee pain likely due to past trauma and possible arthritis.  - Ordered knee x-rays. - Referred to orthopedic specialist. - Recommended Voltaren gel for topical anti-inflammatory relief. - Advised continued physical activity to promote joint lubrication. - Discussed potential for prescription NSAIDs if over-the-counter medications are inadequate.      Relevant Orders   DG Knee 1-2 Views Left   DG Knee 1-2 Views Right   Ambulatory referral to Orthopedic Surgery   Daily headache - Primary   Chronic daily headaches likely tension-type due to bilateral frontal location. Caffeine intake may contribute. - Referred to neurologist for further evaluation. - Provided headache prevention information. - Advised keeping a headache diary to track triggers such as caffeine intake, sleep, hydration, and meals. - Continue current over-the-counter medications as needed.      Relevant Orders   Ambulatory referral to Neurology   TSH   B12 and Folate Panel   Other Visit Diagnoses       Fatigue, unspecified type     Persistent fatigue despite adequate sleep. Possible contributing factors include low testosterone  or other metabolic issues. - Ordered B12 and thyroid function tests. - Scheduled morning lab appointment for testosterone  level. - Will consider Wellbutrin if blood work is normal and fatigue persists.    Relevant Orders   Testosterone    TSH   B12 and Folate Panel     Decreased libido     Decreased libido and fatigue potentially related to low testosterone  levels. - Scheduled morning lab  appointment for  testosterone  level. - Will consider referral to urologist for testosterone  replacement if levels are low and symptomatic.   Relevant Orders   Testosterone          Follow-up: Return in about 6 months (around 06/25/2025) for physical.   Waddell B. Almarie, DNP, FNP-C  I,Emily Lagle,acting as a neurosurgeon for Waddell KATHEE Almarie, NP.,have documented all relevant documentation on the behalf of Waddell KATHEE Almarie, NP.   I, Waddell KATHEE Almarie, NP, have reviewed all documentation for this visit. The documentation on 12/26/2024 for the exam, diagnosis, procedures, and orders are all accurate and complete.  "

## 2024-12-26 NOTE — Assessment & Plan Note (Signed)
 Last A1c 6.3% Continue metformin  1,000 mg BID and Ozempic  2 mg weekly  Continue low carb diet and regular exercise Recheck A1c

## 2024-12-26 NOTE — Patient Instructions (Signed)
 Lifestyle modification: avoid triggers (chocolate, ETOH, some cheeses, MSG, artificial sweeteners, perfume, stress, too much/little sleep, hunger, etc); encourage exercise, good posture, sunglasses, blue light blocking glasses   Analgesics: NSAIDS (ibuprofen, Aleve), Tylenol (take at onset) - try to limit to 2 treatment days/week to avoid rebound headaches; can combine with triptans   Consider trying Riboflavin (up to 400 mg daily) supplement for prevention - this will turn your urine bright yellow.   If having migraines more than 4 times per month, or if migraines last longer than 12 hours, follow-up so we can discuss other options.   Please try keeping a headache diary so we can look for patterns.

## 2024-12-27 ENCOUNTER — Other Ambulatory Visit: Payer: Self-pay | Admitting: Neurology

## 2024-12-27 MED ORDER — LISINOPRIL 5 MG PO TABS: 5.0000 mg | ORAL_TABLET | Freq: Every day | ORAL | 3 refills | Status: AC

## 2024-12-28 ENCOUNTER — Ambulatory Visit: Payer: Self-pay | Admitting: Family Medicine

## 2024-12-28 ENCOUNTER — Ambulatory Visit

## 2024-12-28 VITALS — BP 120/72 | HR 76 | Ht 70.0 in | Wt 213.6 lb

## 2024-12-28 DIAGNOSIS — M25561 Pain in right knee: Secondary | ICD-10-CM

## 2024-12-28 DIAGNOSIS — M222X1 Patellofemoral disorders, right knee: Secondary | ICD-10-CM

## 2024-12-28 DIAGNOSIS — G8929 Other chronic pain: Secondary | ICD-10-CM

## 2024-12-28 DIAGNOSIS — M222X2 Patellofemoral disorders, left knee: Secondary | ICD-10-CM

## 2024-12-28 DIAGNOSIS — M25562 Pain in left knee: Secondary | ICD-10-CM

## 2024-12-28 DIAGNOSIS — F32A Depression, unspecified: Secondary | ICD-10-CM

## 2024-12-28 LAB — TSH: TSH: 1.65 u[IU]/mL (ref 0.450–4.500)

## 2024-12-28 LAB — HEMOGLOBIN A1C
Est. average glucose Bld gHb Est-mCnc: 128 mg/dL
Hgb A1c MFr Bld: 6.1 % — ABNORMAL HIGH (ref 4.8–5.6)

## 2024-12-28 LAB — B12 AND FOLATE PANEL
Folate: 14.7 ng/mL
Vitamin B-12: 833 pg/mL (ref 232–1245)

## 2024-12-28 LAB — TESTOSTERONE: Testosterone: 356 ng/dL (ref 264–916)

## 2024-12-28 NOTE — Progress Notes (Signed)
 "  Office Visit Note   Patient: Nathaniel Cox           Date of Birth: 11-23-1971           MRN: [id number] Visit Date: 12/28/2024              Requested by: Almarie Waddell NOVAK, NP [address] [place],  [place] [address] PCP: Almarie Waddell NOVAK, NP   Assessment & Plan: Visit Diagnoses:  1. Patellofemoral arthralgia of both knees   2. Chronic pain of both knees     Plan: Natural history and expected course discussed. Questions answered. Recommend rest and ice. PT referral for knee VMO strengthening. Recommed NSAIDS as needed for pain. Patellar stabilization brace. Will follow up in 3 months  Orders:  Orders Placed This Encounter  Procedures   DG Knee 1-2 Views Left   DG Knee 1-2 Views Right     Subjective: Chief Complaint: Bilateral knee pain  HPI Patient is a 54 y.o. year old male who presents with knee pain involving the  bilateral knee. Onset of the symptoms was several years ago. Inciting event: was in the [employer] and had trauma to both knees multiple times. Current symptoms include pain located in the front of the knee. Pain is aggravated by going up and down stairs and inactivity. Treatment to date: none.  Objective: Vital Signs: BP 120/72   Pulse 76   Ht 5' 10 (1.778 m)   Wt 96.9 kg   BMI 30.65 kg/m   Physical Exam Gen: Alert, No Acute Distress right knee: Skin intact, no erythema or induration noted. lateral patellar facet tenderness to palpation. Range of motion 0 to 135. No instability with varus or valgus stress. + patellar grind left knee: Skin intact, no erythema or induration noted. lateral patellar facet tenderness to palpation. Range of motion 0 to 135. No instability with varus or valgus stress. + patellar grind  Imaging: Radiographs personally reviewed by me; reveal subchondral sclerosis of the patella seen on sunrise views of both knees, otherwise no abnormalities   PMFS History: Patient Active Problem List   Diagnosis Date Noted    Chronic pain of both knees 12/26/2024   Daily headache 12/26/2024   Status post laparoscopic cholecystectomy 01/24/2024   Portal hypertension (HCC) 12/16/2022   Screening for colon cancer 10/08/2022   Gastric erythema 10/08/2022   Cecal polyp 10/08/2022   Adenomatous polyp of transverse colon 10/08/2022   Rectal polyp 10/08/2022   Other cirrhosis of liver (HCC) 06/25/2022   Hypogonadism in male 11/19/2021   Acanthosis nigricans 08/07/2021   Essential hypertension 08/07/2021   Uncontrolled type 2 diabetes mellitus with hyperglycemia (HCC) 08/07/2021   Swelling of finger of right hand 12/02/2017   Gastrocnemius muscle strain, left, initial encounter 10/26/2016   Combined arterial insufficiency and corporo-venous occlusive erectile dysfunction 04/28/2016   Onychocryptosis 05/27/2015   Depression 10/22/2011   Hemochromatosis 10/22/2011   History of constipation 10/22/2011   Hyperlipidemia 10/22/2011   Microalbuminuria 10/22/2011   NASH (nonalcoholic steatohepatitis) 10/22/2011   Type 2 diabetes mellitus without complication, with long-term current use of insulin  (HCC) 10/22/2011   Past Medical History:  Diagnosis Date   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 Managed by [facility]; diagnosis has been questioned as LFTs have normalized without need for continued blood draws   History of constipation 10/22/2011   Hyperlipidemia    Hypertension    Hypogonadism in male 11/19/2021   Microalbuminuria 10/22/2011   NASH (nonalcoholic steatohepatitis) 10/22/2011   Formatting of this note might be different from the  original. Followed by [facility]   Onychocryptosis 05/27/2015   OSA (obstructive sleep apnea) 10/22/2011   Formatting of this note might be different from the original. Apr 25, 2013:  Polysomnography study at [facility] revealed mild obstructive sleep apnea with an overall index of 14 events per our.  Oxygen saturation minimum 77%.  Epworth sleepiness Scale score:  22/24.  BMI:  34. Neck size:  16.5 in.   Swelling of finger of right hand 12/02/2017   Type 2 diabetes mellitus without complication, with long-term current use of insulin  (HCC) 10/22/2011   Uncontrolled type 2 diabetes mellitus with hyperglycemia (HCC) 08/07/2021    Family History  Problem Relation Age of Onset   Liver cancer Mother    Liver disease Mother    Lung cancer Mother    Cancer Mother    Hyperlipidemia Father    Hypertension Father    Coronary artery disease Father    Heart disease Father    Coronary artery disease Brother    Kidney disease Brother     Past Surgical History:  Procedure Laterality Date   CHOLECYSTECTOMY  12/2023   COLONOSCOPY WITH PROPOFOL  N/A 10/08/2022   Procedure: COLONOSCOPY WITH PROPOFOL ;  Surgeon: Unk Corinn Skiff, MD;  Location: [facility] ENDOSCOPY;  Service: Gastroenterology;  Laterality: N/A;   HAND SURGERY Right    Social History   Occupational History   Not on file  Tobacco Use   Smoking status: Never    Passive exposure: Never   Smokeless tobacco: Never  Vaping Use   Vaping status: Never Used  Substance and Sexual Activity   Alcohol use: Never   Drug use: Never   Sexual activity: Yes   Current Outpatient Medications  Medication Instructions   escitalopram  (LEXAPRO ) 20 mg, Oral, Daily   glucose blood test strip Use as instructed to check blood sugar   lisinopril  (ZESTRIL ) 5 mg, Oral, Daily   metFORMIN  (GLUCOPHAGE ) 1,000 mg, Oral, 2 times daily with meals   OZEMPIC , 2 MG/DOSE, 8 MG/3ML SOPN INJECT 2 MG UNDER THE SKIN EVERY 7 DAYS   rosuvastatin  (CRESTOR ) 10 mg,  Oral, Daily   Allergies as of 12/28/2024 - Review Complete 12/26/2024  Allergen Reaction Noted   Atorvastatin Other (See Comments) 11/12/2021   "

## 2024-12-30 MED ORDER — BUPROPION HCL ER (XL) 150 MG PO TB24: 150.0000 mg | ORAL_TABLET | Freq: Every day | ORAL | 3 refills | Status: AC

## 2025-01-01 ENCOUNTER — Encounter: Payer: Self-pay | Admitting: Family Medicine

## 2025-01-02 ENCOUNTER — Ambulatory Visit

## 2025-01-02 ENCOUNTER — Ambulatory Visit (INDEPENDENT_AMBULATORY_CARE_PROVIDER_SITE_OTHER)

## 2025-01-02 VITALS — BP 142/81 | HR 73 | Ht 70.0 in | Wt 213.0 lb

## 2025-01-02 DIAGNOSIS — M25571 Pain in right ankle and joints of right foot: Secondary | ICD-10-CM

## 2025-01-02 DIAGNOSIS — M25572 Pain in left ankle and joints of left foot: Secondary | ICD-10-CM

## 2025-01-02 NOTE — Progress Notes (Signed)
 "  Office Visit Note   Patient: Nathaniel Cox           Date of Birth: 1971-10-17           MRN: [id number] Visit Date: 01/02/2025              Requested by: Almarie Waddell NOVAK, NP [address] [place],  [place] [address] PCP: Almarie Waddell NOVAK, NP   Assessment & Plan: Visit Diagnoses:  1. Pain in left ankle and joints of left foot   2. Pain in right ankle and joints of right foot     Plan: Patient referred to foot and ankle specialists for evaluation and treatment.   Orders:  Orders Placed This Encounter  Procedures   DG Ankle Complete Left   DG Ankle Complete Right   Ambulatory referral to Podiatry     Subjective: Chief Complaint: Bilateral ankle pain  HPI Patient is a 54 y.o. year old male who presents with pain involving both ankles. Patient was in the [employer] and states that he sprained both ankles multiple times. Now has intermittent pain in the ankles. Hurts when he runs. Pain is on the outside of the ankles. No other complaints.  Objective: Vital Signs: BP (!) 142/81   Pulse 73   Ht 5' 10 (1.778 m)   Wt 96.6 kg   BMI 30.56 kg/m   Physical Exam Gen: Alert, No Acute Distress right ankle: Skin intact, no erythema or induration noted. ATFL tenderness to palpation. Full ankle ROM. No instability with varus or valgus stress. Left ankle: Skin intact, no erythema or induration noted. ATFL tenderness to palpation. Full ankle ROM. No instability with varus or valgus stress.  Imaging: Radiographs personally reviewed by me; reveal no abnormalities of the bilateral ankles   PMFS History: Patient Active Problem List   Diagnosis Date Noted   Chronic pain of both knees 12/26/2024   Daily headache 12/26/2024   Status post laparoscopic cholecystectomy 01/24/2024   Portal hypertension (HCC) 12/16/2022   Screening for colon cancer 10/08/2022   Gastric erythema 10/08/2022   Cecal polyp 10/08/2022   Adenomatous polyp of transverse colon 10/08/2022   Rectal  polyp 10/08/2022   Other cirrhosis of liver (HCC) 06/25/2022   Hypogonadism in male 11/19/2021   Acanthosis nigricans 08/07/2021   Essential hypertension 08/07/2021   Uncontrolled type 2 diabetes mellitus with hyperglycemia (HCC) 08/07/2021   Swelling of finger of right hand 12/02/2017   Gastrocnemius muscle strain, left, initial encounter 10/26/2016   Combined arterial insufficiency and corporo-venous occlusive erectile dysfunction 04/28/2016   Onychocryptosis 05/27/2015   Depression 10/22/2011   Hemochromatosis 10/22/2011   History of constipation 10/22/2011   Hyperlipidemia 10/22/2011   Microalbuminuria 10/22/2011   NASH (nonalcoholic steatohepatitis) 10/22/2011   Type 2 diabetes mellitus without complication, with long-term current use of insulin  (HCC) 10/22/2011   Past Medical History:  Diagnosis Date   CCC (chronic calculous cholecystitis) 01/05/2024   Chronic cholecystitis with calculus    Cirrhosis (HCC)    Combined arterial insufficiency and corporo-venous occlusive erectile dysfunction 04/28/2016   Depression 10/22/2011   Essential hypertension 08/07/2021   Gastrocnemius muscle strain, left, initial encounter 10/26/2016   Hemochromatosis 10/22/2011   Formatting of this note might be different from the original. Managed by [facility]; diagnosis has been questioned as LFTs have normalized without need for continued blood draws   History of constipation 10/22/2011   Hyperlipidemia    Hypertension    Hypogonadism in male 11/19/2021  Microalbuminuria 10/22/2011   NASH (nonalcoholic steatohepatitis) 10/22/2011   Formatting of this note might be different from the original. Followed by [facility]   Onychocryptosis 05/27/2015   OSA (obstructive sleep apnea) 10/22/2011   Formatting of this note might be different from the original. Apr 25, 2013:  Polysomnography study at [facility] revealed mild obstructive sleep apnea with an overall index of 14 events per our.  Oxygen  saturation minimum 77%.  Epworth sleepiness Scale score:  22/24.  BMI:  34. Neck size:  16.5 in.   Swelling of finger of right hand 12/02/2017   Type 2 diabetes mellitus without complication, with long-term current use of insulin  (HCC) 10/22/2011   Uncontrolled type 2 diabetes mellitus with hyperglycemia (HCC) 08/07/2021    Family History  Problem Relation Age of Onset   Liver cancer Mother    Liver disease Mother    Lung cancer Mother    Cancer Mother    Hyperlipidemia Father    Hypertension Father    Coronary artery disease Father    Heart disease Father    Coronary artery disease Brother    Kidney disease Brother     Past Surgical History:  Procedure Laterality Date   CHOLECYSTECTOMY  12/2023   COLONOSCOPY WITH PROPOFOL  N/A 10/08/2022   Procedure: COLONOSCOPY WITH PROPOFOL ;  Surgeon: Unk Corinn Skiff, MD;  Location: [facility] ENDOSCOPY;  Service: Gastroenterology;  Laterality: N/A;   HAND SURGERY Right    Social History   Occupational History   Not on file  Tobacco Use   Smoking status: Never    Passive exposure: Never   Smokeless tobacco: Never  Vaping Use   Vaping status: Never Used  Substance and Sexual Activity   Alcohol use: Never   Drug use: Never   Sexual activity: Yes   Current Outpatient Medications  Medication Instructions   buPROPion  (WELLBUTRIN  XL) 150 mg, Oral, Daily   escitalopram  (LEXAPRO ) 20 mg, Oral, Daily   glucose blood test strip Use as instructed to check blood sugar   lisinopril  (ZESTRIL ) 5 mg, Oral, Daily   metFORMIN  (GLUCOPHAGE ) 1,000 mg, Oral, 2 times daily with meals   OZEMPIC , 2 MG/DOSE, 8 MG/3ML SOPN INJECT 2 MG UNDER THE SKIN EVERY 7 DAYS   rosuvastatin  (CRESTOR ) 10 mg, Oral, Daily   Allergies as of 01/02/2025 - Review Complete 12/26/2024  Allergen Reaction Noted   Atorvastatin Other (See Comments) 11/12/2021   "

## 2025-01-03 ENCOUNTER — Ambulatory Visit
Admission: RE | Admit: 2025-01-03 | Discharge: 2025-01-03 | Disposition: A | Discharge status: Home / Self Care | Source: Ambulatory Visit | Attending: Nurse Practitioner | Admitting: Nurse Practitioner

## 2025-01-03 ENCOUNTER — Ambulatory Visit

## 2025-01-03 VITALS — BP 122/68 | HR 80 | Ht 70.0 in | Wt 213.0 lb

## 2025-01-03 DIAGNOSIS — K766 Portal hypertension: Secondary | ICD-10-CM

## 2025-01-03 DIAGNOSIS — M25551 Pain in right hip: Secondary | ICD-10-CM

## 2025-01-03 DIAGNOSIS — M25552 Pain in left hip: Secondary | ICD-10-CM | POA: Diagnosis not present

## 2025-01-03 DIAGNOSIS — K7469 Other cirrhosis of liver: Secondary | ICD-10-CM

## 2025-01-03 DIAGNOSIS — M1612 Unilateral primary osteoarthritis, left hip: Secondary | ICD-10-CM | POA: Diagnosis not present

## 2025-01-03 DIAGNOSIS — K7581 Nonalcoholic steatohepatitis (NASH): Secondary | ICD-10-CM

## 2025-01-03 DIAGNOSIS — M7061 Trochanteric bursitis, right hip: Secondary | ICD-10-CM | POA: Diagnosis not present

## 2025-01-03 NOTE — Progress Notes (Signed)
 "  Office Visit Note   Patient: Nathaniel Cox           Date of Birth: Nov 03, 1971           MRN: [id number] Visit Date: 01/03/2025              Requested by: Almarie Waddell NOVAK, NP [address] [place],  [place] [address] PCP: Almarie Waddell NOVAK, NP   Assessment & Plan: Visit Diagnoses:  1. Unilateral primary osteoarthritis, left hip   2. Bilateral hip pain   3. Trochanteric bursitis, right hip     Plan: Natural history and expected course discussed. Questions answered. Recommend rest and ice. PT referral for hip strengthening and ROM. OTC analgesics as needed. Follow up prn  Orders:  Orders Placed This Encounter  Procedures   DG HIPS BILAT WITH PELVIS 3-4 VIEWS   Ambulatory referral to Physical Therapy     Subjective: Chief Complaint: Bilateral hip pain  HPI Patient is a 54 y.o. year old male who complains of bilateral hip pain. Onset of the symptoms was several years ago. Inciting event: none. Current symptoms include groin pain. Aggravating symptoms: rising after sitting. Patient's overall course: stable. Patient has had no prior history of hip problems.. Treatment to date: none.  Objective: Vital Signs: BP 122/68   Pulse 80   Ht 5' 10 (1.778 m)   Wt 96.6 kg   BMI 30.56 kg/m   Physical Exam Gen: Alert, No Acute Distress right hip: Skin intact, no erythema or induration noted. lateral trochanteric tenderness to palpation. Full range of motion. Negative log roll left hip: Skin intact, no erythema or induration noted. Groin tenderness to palpation. Full range of motion. Positive log roll  Imaging: Radiographs personally reviewed by me; reveal mild osteoarthritis of the bilateral hip   PMFS History: Patient Active Problem List   Diagnosis Date Noted   Chronic pain of both knees 12/26/2024   Daily headache 12/26/2024   Status post laparoscopic cholecystectomy 01/24/2024   Portal hypertension (HCC) 12/16/2022   Screening for colon cancer 10/08/2022    Gastric erythema 10/08/2022   Cecal polyp 10/08/2022   Adenomatous polyp of transverse colon 10/08/2022   Rectal polyp 10/08/2022   Other cirrhosis of liver (HCC) 06/25/2022   Hypogonadism in male 11/19/2021   Acanthosis nigricans 08/07/2021   Essential hypertension 08/07/2021   Uncontrolled type 2 diabetes mellitus with hyperglycemia (HCC) 08/07/2021   Swelling of finger of right hand 12/02/2017   Gastrocnemius muscle strain, left, initial encounter 10/26/2016   Combined arterial insufficiency and corporo-venous occlusive erectile dysfunction 04/28/2016   Onychocryptosis 05/27/2015   Depression 10/22/2011   Hemochromatosis 10/22/2011   History of constipation 10/22/2011   Hyperlipidemia 10/22/2011   Microalbuminuria 10/22/2011   NASH (nonalcoholic steatohepatitis) 10/22/2011   Type 2 diabetes mellitus without complication, with long-term current use of insulin  (HCC) 10/22/2011   Past Medical History:  Diagnosis Date   CCC (chronic calculous cholecystitis) 01/05/2024   Chronic cholecystitis with calculus    Cirrhosis (HCC)    Combined arterial insufficiency and corporo-venous occlusive erectile dysfunction 04/28/2016   Depression 10/22/2011   Essential hypertension 08/07/2021   Gastrocnemius muscle strain, left, initial encounter 10/26/2016   Hemochromatosis 10/22/2011   Formatting of this note might be different from the original. Managed by [facility]; diagnosis has been questioned as LFTs have normalized without need for continued blood draws   History of constipation 10/22/2011   Hyperlipidemia    Hypertension  Hypogonadism in male 11/19/2021   Microalbuminuria 10/22/2011   NASH (nonalcoholic steatohepatitis) 10/22/2011   Formatting of this note might be different from the original. Followed by [facility]   Onychocryptosis 05/27/2015   OSA (obstructive sleep apnea) 10/22/2011   Formatting of this note might be different from the original. Apr 25, 2013:  Polysomnography  study at [facility] revealed mild obstructive sleep apnea with an overall index of 14 events per our.  Oxygen saturation minimum 77%.  Epworth sleepiness Scale score:  22/24.  BMI:  34. Neck size:  16.5 in.   Swelling of finger of right hand 12/02/2017   Type 2 diabetes mellitus without complication, with long-term current use of insulin  (HCC) 10/22/2011   Uncontrolled type 2 diabetes mellitus with hyperglycemia (HCC) 08/07/2021    Family History  Problem Relation Age of Onset   Liver cancer Mother    Liver disease Mother    Lung cancer Mother    Cancer Mother    Hyperlipidemia Father    Hypertension Father    Coronary artery disease Father    Heart disease Father    Coronary artery disease Brother    Kidney disease Brother     Past Surgical History:  Procedure Laterality Date   CHOLECYSTECTOMY  12/2023   COLONOSCOPY WITH PROPOFOL  N/A 10/08/2022   Procedure: COLONOSCOPY WITH PROPOFOL ;  Surgeon: Unk Corinn Skiff, MD;  Location: [facility] ENDOSCOPY;  Service: Gastroenterology;  Laterality: N/A;   HAND SURGERY Right    Social History   Occupational History   Not on file  Tobacco Use   Smoking status: Never    Passive exposure: Never   Smokeless tobacco: Never  Vaping Use   Vaping status: Never Used  Substance and Sexual Activity   Alcohol use: Never   Drug use: Never   Sexual activity: Yes   Current Outpatient Medications  Medication Instructions   buPROPion  (WELLBUTRIN  XL) 150 mg, Oral, Daily   escitalopram  (LEXAPRO ) 20 mg, Oral, Daily   glucose blood test strip Use as instructed to check blood sugar   lisinopril  (ZESTRIL ) 5 mg, Oral, Daily   metFORMIN  (GLUCOPHAGE ) 1,000 mg, Oral, 2 times daily with meals   OZEMPIC , 2 MG/DOSE, 8 MG/3ML SOPN INJECT 2 MG UNDER THE SKIN EVERY 7 DAYS   rosuvastatin  (CRESTOR ) 10 mg, Oral, Daily   Allergies as of 01/03/2025 - Review Complete 12/26/2024  Allergen Reaction Noted   Atorvastatin Other (See Comments) 11/12/2021   "

## 2025-01-09 ENCOUNTER — Ambulatory Visit (INDEPENDENT_AMBULATORY_CARE_PROVIDER_SITE_OTHER)

## 2025-01-09 ENCOUNTER — Ambulatory Visit

## 2025-01-09 DIAGNOSIS — M7751 Other enthesopathy of right foot: Secondary | ICD-10-CM

## 2025-01-09 DIAGNOSIS — M2141 Flat foot [pes planus] (acquired), right foot: Secondary | ICD-10-CM | POA: Diagnosis not present

## 2025-01-09 DIAGNOSIS — M7752 Other enthesopathy of left foot: Secondary | ICD-10-CM

## 2025-01-09 DIAGNOSIS — M778 Other enthesopathies, not elsewhere classified: Secondary | ICD-10-CM

## 2025-01-09 DIAGNOSIS — M25371 Other instability, right ankle: Secondary | ICD-10-CM

## 2025-01-09 DIAGNOSIS — M25372 Other instability, left ankle: Secondary | ICD-10-CM | POA: Diagnosis not present

## 2025-01-09 DIAGNOSIS — M2142 Flat foot [pes planus] (acquired), left foot: Secondary | ICD-10-CM | POA: Diagnosis not present

## 2025-01-09 MED ORDER — METHYLPREDNISOLONE 4 MG PO TBPK: ORAL_TABLET | ORAL | 0 refills | Status: AC

## 2025-01-09 NOTE — Patient Instructions (Signed)

## 2025-01-09 NOTE — Progress Notes (Signed)
 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 He is diabetic - A1c is 6.1. he states he has a hx of rolling his ankles.      Discussed the use of AI scribe software for clinical note transcription with the patient, who gave verbal consent to proceed.  History of Present Illness Nathaniel Cox is a 54 year old male with recurrent ankle sprains who presents with chronic left ankle instability and pain.  He has had lateral ankle pain in both ankles for many years, worse on the left, following multiple inversion injuries during [place]. The pain feels sore and is worsened by cold weather and by plantarflexion while lying down. He has a sense of laxity and instability in the left ankle, especially on uneven ground or with forced inversion in the evening. Dorsiflexion causes mild discomfort, while other ankle motions are not significantly painful. He previously used lace-up ankle braces but is trying to limit use of them now.  He also has persistent plantar pain in the left foot, previously attributed to a calcaneal spur. The pain is chronic but not acutely severe. He has not had corticosteroid injections or taken oral steroids for this problem.     Review of Systems: Negative except as noted in the HPI. Denies N/V/F/Ch.  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 Managed by [facility]; diagnosis has been questioned as  LFTs have normalized without need for continued blood draws   History of constipation 10/22/2011   Hyperlipidemia    Hypertension    Hypogonadism in male 11/19/2021   Microalbuminuria 10/22/2011   NASH (nonalcoholic steatohepatitis) 10/22/2011   Formatting of this note might be different from the original. Followed by [facility]   Onychocryptosis 05/27/2015   OSA (obstructive sleep apnea) 10/22/2011   Formatting of this note might be different from the original. Apr 25, 2013:  Polysomnography study at [facility] revealed mild obstructive sleep apnea with an overall index of 14 events per our.  Oxygen saturation minimum 77%.  Epworth sleepiness Scale score:  22/24.  BMI:  34. Neck size:  16.5 in.   Swelling of finger of right hand 12/02/2017   Type 2 diabetes mellitus without complication, with long-term current use of insulin  (HCC) 10/22/2011   Uncontrolled type 2 diabetes mellitus with hyperglycemia (HCC) 08/07/2021   Current Medications[1]  Tobacco Use History[2]  Allergies[3] Objective:   Constitutional Well developed. Well nourished. Oriented to person, place, and time.  Vascular Dorsalis pedis pulses palpable bilaterally. Posterior tibial pulses palpable bilaterally. Capillary refill normal to all digits.  No cyanosis or clubbing noted. Pedal hair growth normal.  Neurologic Normal speech. Epicritic sensation to light touch grossly intact bilaterally. Negative tinel sign at tarsal tunnel bilaterally.   Dermatologic Skin texture and turgor are within normal limits.  No open wounds. No skin lesions.  Musculoskeletal: 5 out of 5 muscle strength all major pedal muscle groups.  No  pain with resisted eversion or inversion, left.  Mild pain to palpation of distal PT tendon, left.  Pain to bilateral lateral ankle ligament complexes, left worse than right.  Mildly positive anterior drawer to the left ankle.  Mild pes planus foot shape.  Pain to palpation of the medial calcaneal  tuber.  No pain to palpation side-to-side squeeze of calcaneus.  Silfverskiold test positive indicating gastroc equinus.  Right ankle exam similar to the left, with decreased pain.  Hindfoot range of motion pain-free and full.  Ankle range of motion unrestricted and pain-free without pain to palpation of medial gutter.   Radiographs: Taken and reviewed of bilateral feet and ankles.  Bilaterally, ankle mortise is are well-maintained without arthropathy or deformity present.  No discrete osteochondral lesions.  Bilateral mild pes planus foot shape with well-maintained joint spaces.  Increased talonavicular joint uncoverage left more than right.  Bilateral hallux abducto valgus deformity.   Assessment:   1. Capsulitis of foot, right   2. Capsulitis of foot, left   3. Capsulitis of ankle, right   4. Capsulitis of ankle, left   5. Bilateral pes planus   6. Ankle instability, left   7. Ankle instability, right      Plan:  Patient was evaluated and treated and all questions answered.  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 No intra-articular joint damage. Further evaluation needed for ligament, tendon, and joint integrity. - Ordered MRI of the left ankle. - We discussed conservative treatment involving physical therapy and immobilization.  He has had this condition for an extended period of time and has attempted bracing in the past.  We discussed that due to chronic nature, MRI indicated to evaluate underlying structures. - Surgical plan may be developed pending results  Chronic right ankle pain Chronic right ankle pain less severe and functionally limiting than the left. No acute findings. Left ankle prioritized for workup.  Left plantar fasciitis Plantar pain consistent with plantar fasciitis, likely due to altered gait mechanics and chronic  overuse. - Provided education on Achilles tendon stretching with printed instructions. - Advised use of supportive footwear with stiff sole. - Prescribed short course of oral corticosteroids. Medrol  dosepak x 6 days tapered.   RTC after MRI completed  Prentice Ovens, DPM AACFAS Fellowship Trained Podiatric Surgeon [facility]     [1]  Current Outpatient Medications:    buPROPion  (WELLBUTRIN  XL) 150 MG 24 hr tablet, Take 1 tablet (150 mg total) by mouth daily., Disp: 30 tablet, Rfl: 3   escitalopram  (LEXAPRO ) 20 MG tablet, Take 1 tablet (20 mg total) by mouth daily., Disp: 90 tablet, Rfl: 3   glucose blood test strip, Use as instructed to check blood sugar, Disp: 100 each, Rfl: 12   lisinopril  (ZESTRIL ) 5 MG tablet, Take 1 tablet (5 mg total) by mouth daily., Disp: 90 tablet, Rfl: 3   metFORMIN  (GLUCOPHAGE ) 1000 MG tablet, Take 1 tablet (1,000 mg total) by mouth 2 (two) times daily with a meal., Disp: 180 tablet, Rfl: 3   OZEMPIC , 2 MG/DOSE, 8 MG/3ML SOPN, INJECT 2 MG UNDER THE SKIN EVERY 7 DAYS, Disp: 3 mL, Rfl: 1   rosuvastatin  (CRESTOR ) 10 MG tablet, Take 1 tablet (10 mg total) by mouth daily., Disp: 90 tablet, Rfl: 3 [2]  Social History Tobacco Use  Smoking Status Never   Passive exposure: Never  Smokeless Tobacco Never  [  3]  Allergies Allergen Reactions   Atorvastatin Other (See Comments)    muscle aches   "

## 2025-01-14 ENCOUNTER — Ambulatory Visit

## 2025-03-25 ENCOUNTER — Encounter: Admitting: Internal Medicine

## 2025-03-28 ENCOUNTER — Ambulatory Visit
